# Patient Record
Sex: Female | Born: 1970 | Race: Black or African American | Hispanic: No | Marital: Married | Smoking: Never smoker
Health system: Southern US, Community
[De-identification: ages and names within clinical notes are randomized; demographics above are authoritative.]

## PROBLEM LIST (undated history)

## (undated) DIAGNOSIS — E785 Hyperlipidemia, unspecified: Secondary | ICD-10-CM

## (undated) DIAGNOSIS — E119 Type 2 diabetes mellitus without complications: Secondary | ICD-10-CM

## (undated) DIAGNOSIS — D649 Anemia, unspecified: Secondary | ICD-10-CM

## (undated) DIAGNOSIS — I10 Essential (primary) hypertension: Secondary | ICD-10-CM

## (undated) DIAGNOSIS — D219 Benign neoplasm of connective and other soft tissue, unspecified: Secondary | ICD-10-CM

## (undated) DIAGNOSIS — N200 Calculus of kidney: Secondary | ICD-10-CM

## (undated) HISTORY — DX: Type 2 diabetes mellitus without complications: E11.9

## (undated) HISTORY — PX: LITHOTRIPSY: SUR834

## (undated) HISTORY — DX: Hyperlipidemia, unspecified: E78.5

---

## 1998-12-11 ENCOUNTER — Other Ambulatory Visit: Admission: RE | Admit: 1998-12-11 | Discharge: 1998-12-11 | Payer: Self-pay | Admitting: Obstetrics and Gynecology

## 2000-02-14 ENCOUNTER — Other Ambulatory Visit: Admission: RE | Admit: 2000-02-14 | Discharge: 2000-02-14 | Payer: Self-pay | Admitting: Obstetrics and Gynecology

## 2000-09-17 ENCOUNTER — Encounter: Payer: Self-pay | Admitting: Gynecology

## 2000-09-17 ENCOUNTER — Encounter: Admission: RE | Admit: 2000-09-17 | Discharge: 2000-09-17 | Payer: Self-pay | Admitting: Gynecology

## 2001-01-09 ENCOUNTER — Inpatient Hospital Stay (HOSPITAL_COMMUNITY): Admission: AD | Admit: 2001-01-09 | Discharge: 2001-01-09 | Payer: Self-pay | Admitting: Gynecology

## 2001-03-30 ENCOUNTER — Encounter: Admission: RE | Admit: 2001-03-30 | Discharge: 2001-06-28 | Payer: Self-pay | Admitting: Gynecology

## 2001-06-01 ENCOUNTER — Inpatient Hospital Stay (HOSPITAL_COMMUNITY): Admission: AD | Admit: 2001-06-01 | Discharge: 2001-06-03 | Payer: Self-pay | Admitting: Gynecology

## 2003-02-17 ENCOUNTER — Other Ambulatory Visit: Admission: RE | Admit: 2003-02-17 | Discharge: 2003-02-17 | Payer: Self-pay | Admitting: Gynecology

## 2003-02-24 ENCOUNTER — Encounter: Admission: RE | Admit: 2003-02-24 | Discharge: 2003-02-24 | Payer: Self-pay | Admitting: *Deleted

## 2003-03-06 ENCOUNTER — Encounter: Payer: Self-pay | Admitting: Urology

## 2003-03-06 ENCOUNTER — Ambulatory Visit (HOSPITAL_BASED_OUTPATIENT_CLINIC_OR_DEPARTMENT_OTHER): Admission: RE | Admit: 2003-03-06 | Discharge: 2003-03-06 | Payer: Self-pay | Admitting: Urology

## 2003-11-22 ENCOUNTER — Encounter: Admission: RE | Admit: 2003-11-22 | Discharge: 2003-11-22 | Payer: Self-pay | Admitting: Gynecology

## 2003-12-26 ENCOUNTER — Inpatient Hospital Stay (HOSPITAL_COMMUNITY): Admission: AD | Admit: 2003-12-26 | Discharge: 2003-12-28 | Payer: Self-pay | Admitting: Gynecology

## 2003-12-26 ENCOUNTER — Encounter (INDEPENDENT_AMBULATORY_CARE_PROVIDER_SITE_OTHER): Payer: Self-pay | Admitting: *Deleted

## 2004-02-06 ENCOUNTER — Other Ambulatory Visit: Admission: RE | Admit: 2004-02-06 | Discharge: 2004-02-06 | Payer: Self-pay | Admitting: Gynecology

## 2005-04-04 ENCOUNTER — Other Ambulatory Visit: Admission: RE | Admit: 2005-04-04 | Discharge: 2005-04-04 | Payer: Self-pay | Admitting: Gynecology

## 2006-05-12 ENCOUNTER — Other Ambulatory Visit: Admission: RE | Admit: 2006-05-12 | Discharge: 2006-05-12 | Payer: Self-pay | Admitting: Gynecology

## 2006-05-30 ENCOUNTER — Emergency Department (HOSPITAL_COMMUNITY): Admission: EM | Admit: 2006-05-30 | Discharge: 2006-05-30 | Payer: Self-pay | Admitting: Emergency Medicine

## 2009-09-04 ENCOUNTER — Emergency Department (HOSPITAL_COMMUNITY): Admission: EM | Admit: 2009-09-04 | Discharge: 2009-09-04 | Payer: Self-pay | Admitting: Emergency Medicine

## 2010-09-15 ENCOUNTER — Emergency Department (HOSPITAL_COMMUNITY)
Admission: EM | Admit: 2010-09-15 | Discharge: 2010-09-15 | Payer: Self-pay | Source: Home / Self Care | Admitting: Emergency Medicine

## 2010-11-25 LAB — URINALYSIS, ROUTINE W REFLEX MICROSCOPIC
Bilirubin Urine: NEGATIVE
Glucose, UA: NEGATIVE mg/dL
Ketones, ur: NEGATIVE mg/dL
Leukocytes, UA: NEGATIVE
Nitrite: NEGATIVE
Protein, ur: NEGATIVE mg/dL
Specific Gravity, Urine: 1.012 (ref 1.005–1.030)
Urobilinogen, UA: 0.2 mg/dL (ref 0.0–1.0)
pH: 5.5 (ref 5.0–8.0)

## 2010-11-25 LAB — URINE MICROSCOPIC-ADD ON

## 2010-12-16 LAB — POCT CARDIAC MARKERS
CKMB, poc: <1 ng/mL — ABNORMAL LOW (ref 1.0–8.0)
CKMB, poc: <1 ng/mL — ABNORMAL LOW (ref 1.0–8.0)
Myoglobin, poc: 80.1 ng/mL (ref 12–200)
Myoglobin, poc: 80.4 ng/mL (ref 12–200)
Troponin i, poc: 0.05 ng/mL (ref 0.00–0.09)
Troponin i, poc: <0.05 ng/mL (ref 0.00–0.09)

## 2010-12-16 LAB — BASIC METABOLIC PANEL
BUN: 8 mg/dL (ref 6–23)
CO2: 25 mEq/L (ref 19–32)
Calcium: 8.6 mg/dL (ref 8.4–10.5)
Chloride: 104 mEq/L (ref 96–112)
Creatinine, Ser: 0.75 mg/dL (ref 0.4–1.2)
GFR calc Af Amer: >60 mL/min (ref >60)
GFR calc non Af Amer: >60 mL/min (ref >60)
Glucose, Bld: 111 mg/dL — ABNORMAL HIGH (ref 70–99)
Potassium: 3.7 mEq/L (ref 3.5–5.1)
Sodium: 136 mEq/L (ref 135–145)

## 2011-01-31 NOTE — Discharge Summary (Signed)
[facility]  Patient:    Eileen Larsen, Eileen Larsen Visit Number: [id number] MRN: [id number]          Service Type: OBS Location: 910A 9110 01 Attending Physician:  Douglass Rivers Dictated by:   Antony Contras, [facility] Admit Date:  06/01/2001 Discharge Date: 06/03/2001                             Discharge Summary  DISCHARGE DIAGNOSES:          1. Intrauterine pregnancy at term.                               2. Spontaneous onset of labor.   PROCEDURES:                   Normal spontaneous vaginal delivery of viable infant over intact perineum with repair of first degree laceration.  HISTORY OF PRESENT ILLNESS:   The patient is a 40 year old gravida 3, para 0-0-2-0 with an LMP 09/01/00, [facility] 06/08/01.  Prenatal course was complicated by gestational diabetes which was diet controlled.  LABORATORY DATA:              Blood type A positive, antibody screen negative. RPR, HBsAG, HIV nonreactive.  Rubella immune.  MSAFP normal.  _______ was negative.  GBS negative.  HOSPITAL COURSE/TREATMENT:    The patient was admitted June 01, 2001 with spontaneous onset of labor at 39 weeks.  Cervix was 3 cm, 100% and -1 station. Artificial rupture of membrane reveals clear fluid.  The patient progressed to complete dilatation delivered an Apgar 9/9 female infant weighing 7 pounds 6 ounces over intact perineum.  First degree laceration was repaired.  POSTPARTUM COURSE:            The patient remained afebrile, had no difficulty voiding and was able to be discharged on her second postpartum day in satisfactory condition.  CBC: Hematocrit 30.2, hemoglobin 10.2, WBC 14.2, platelets 215.  DISPOSITION:                  Follow up in six weeks.  Continue prenatal vitamins with iron, Motrin/Tylox for pain. Dictated by:   Antony Contras, [facility] Attending Physician:  Douglass Rivers DD:  07/02/01 TD:  07/05/01 Job: [id number]

## 2011-01-31 NOTE — Discharge Summary (Signed)
NAME:  Eileen Larsen, Eileen Larsen                        ACCOUNT NO.:  [id number]   MEDICAL RECORD NO.:  [id number]                   PATIENT TYPE:  INP   LOCATION:  9128                                 FACILITY:  [facility]   PHYSICIAN:  Timothy P. Fontaine, M.D.           DATE OF BIRTH:  May 31, 1971   DATE OF ADMISSION:  12/26/2003  DATE OF DISCHARGE:  12/28/2003                                 DISCHARGE SUMMARY   DISCHARGE DIAGNOSES:  1. Pregnancy at term, delivered.  2. History of gestational diabetes.   PROCEDURE:  Spontaneous vaginal delivery of normal female infant on December 26, 2003.   HOSPITAL COURSE:  A 40 year old G67 P1 female at term admitted with regular  contractions at 3 cm dilatation.  The patient progressed without difficulty  and underwent a spontaneous vaginal delivery producing a normal female infant,  Apgars 9 and 9 at 21.  The patient had a second small midline laceration  which was repaired without difficulty.  Her postpartum course was  uncomplicated and she was discharged on postpartum day #2 with a hemoglobin  of 10.3.  Her blood type is A positive and her rubella titer was positive.  The patient was given precautions, instructions, and follow-up information,  will be seen in the office in 4-6 weeks having been instructed to check her  blood sugars prior to her postpartum checkup.  She was given a prescription  for Percocet #20 one to two p.o. q.4-6h. p.r.n. pain.                                               Timothy P. Audie Box, M.D.    TPF/MEDQ  D:  01/09/2004  T:  01/09/2004  Job:  [id number]

## 2014-01-07 ENCOUNTER — Telehealth (HOSPITAL_BASED_OUTPATIENT_CLINIC_OR_DEPARTMENT_OTHER): Payer: Self-pay | Admitting: *Deleted

## 2014-01-07 ENCOUNTER — Encounter (HOSPITAL_BASED_OUTPATIENT_CLINIC_OR_DEPARTMENT_OTHER): Payer: Self-pay | Admitting: Emergency Medicine

## 2014-01-07 ENCOUNTER — Emergency Department (HOSPITAL_BASED_OUTPATIENT_CLINIC_OR_DEPARTMENT_OTHER)
Admission: EM | Admit: 2014-01-07 | Discharge: 2014-01-07 | Disposition: A | Discharge status: Home / Self Care | Payer: Managed Care, Other (non HMO) | Attending: Emergency Medicine | Admitting: Emergency Medicine

## 2014-01-07 DIAGNOSIS — Z79899 Other long term (current) drug therapy: Secondary | ICD-10-CM | POA: Insufficient documentation

## 2014-01-07 DIAGNOSIS — I10 Essential (primary) hypertension: Secondary | ICD-10-CM | POA: Insufficient documentation

## 2014-01-07 DIAGNOSIS — Z87442 Personal history of urinary calculi: Secondary | ICD-10-CM | POA: Insufficient documentation

## 2014-01-07 DIAGNOSIS — B0052 Herpesviral keratitis: Secondary | ICD-10-CM | POA: Insufficient documentation

## 2014-01-07 DIAGNOSIS — Z792 Long term (current) use of antibiotics: Secondary | ICD-10-CM | POA: Insufficient documentation

## 2014-01-07 DIAGNOSIS — R51 Headache: Secondary | ICD-10-CM | POA: Insufficient documentation

## 2014-01-07 HISTORY — DX: Essential (primary) hypertension: I10

## 2014-01-07 HISTORY — DX: Calculus of kidney: N20.0

## 2014-01-07 MED ORDER — TRIFLURIDINE 1 % OP SOLN: 1.0000 [drp] | OPHTHALMIC | Status: DC

## 2014-01-07 MED ORDER — TETRACAINE HCL 0.5 % OP SOLN
2.0000 [drp] | Freq: Once | OPHTHALMIC | Status: AC
  Administered 2014-01-07: 2 [drp] via OPHTHALMIC
  Filled 2014-01-07: qty 2

## 2014-01-07 MED ORDER — FLUORESCEIN SODIUM 1 MG OP STRP
1.0000 | ORAL_STRIP | Freq: Once | OPHTHALMIC | Status: AC
  Administered 2014-01-07: 1 via OPHTHALMIC
  Filled 2014-01-07: qty 1

## 2014-01-07 MED ORDER — TRIFLURIDINE 1 % OP SOLN
1.0000 [drp] | OPHTHALMIC | Status: DC
  Filled 2014-01-07: qty 7.5

## 2014-01-07 NOTE — ED Notes (Signed)
Dr. Tamera Punt is aware that we do not have Viroptic on our formulary here at [facility].

## 2014-01-07 NOTE — ED Notes (Signed)
The woods lamp and slit lamp are the bedside set up and ready for the doctor to use.

## 2014-01-07 NOTE — ED Provider Notes (Signed)
CSN: [id number]     Arrival date & time 01/07/14  1639 History  This chart was scribed for Malvin Johns, MD by Marcha Dutton, ED Scribe. This patient was seen in room MH06/MH06 and the patient's care was started at 4:59 PM.    Chief Complaint  Patient presents with  . Eye Problem     The history is provided by the patient. No language interpreter was used.   HPI Comments: Eileen Larsen is a 43 y.o. female who presents to the Emergency Department complaining of mild right eye pain that began 3 weeks ago. She states it feels as though something is stuck in her eye. Pt reports difficulty keeping rt eye open, blurred vision for 5 days, photophobia, eye discharge, and a headache. She was seen at an urgent care the first week in April while on vacation for 3 or so raised pus like blisters around her lower right eye lid that were neither itchy nor painful that lasted for 5 days. She reports she was given prednisone, which did clear up her bumps, but states she still felt like something was in her eye. She reports being seen again by her PCP 4/17 and received Vigamox eye drops which she has been using for a week. She states the feeling has not improved, that she's struggling to keep her eyes open, and her blurred vision is worsening.    Past Medical History  Diagnosis Date  . Hypertension   . Kidney stone    Past Surgical History  Procedure Laterality Date  . Lithotripsy     No family history on file. History  Substance Use Topics  . Smoking status: Never Smoker   . Smokeless tobacco: Never Used  . Alcohol Use: Yes     Comment: rare   OB History   Grav Para Term Preterm Abortions TAB SAB Ect Mult Living                 Review of Systems  Constitutional: Negative for fever, chills, diaphoresis and fatigue.  HENT: Negative for congestion, rhinorrhea and sneezing.   Eyes: Positive for photophobia, pain, discharge and visual disturbance.  Respiratory: Negative for cough, chest  tightness and shortness of breath.   Cardiovascular: Negative for chest pain and leg swelling.  Gastrointestinal: Negative for nausea, vomiting, abdominal pain, diarrhea and blood in stool.  Genitourinary: Negative for frequency, hematuria, flank pain and difficulty urinating.  Musculoskeletal: Negative for arthralgias and back pain.  Skin: Negative for rash.  Neurological: Positive for headaches. Negative for dizziness, speech difficulty, weakness and numbness.      Allergies  Review of patient's allergies indicates no known allergies.  Home Medications   Prior to Admission medications   Medication Sig Start Date End Date Taking? Authorizing Provider  amLODipine (NORVASC) 5 MG tablet Take 5 mg by mouth daily.   Yes Historical Provider, MD  folic acid (FOLVITE) 1 MG tablet Take 1 mg by mouth daily.   Yes Historical Provider, MD  hydrochlorothiazide (HYDRODIURIL) 25 MG tablet Take 25 mg by mouth daily.   Yes Historical Provider, MD  moxifloxacin (VIGAMOX) 0.5 % ophthalmic solution Place 1 drop into the right eye 3 (three) times daily.   Yes Historical Provider, MD   Triage Vitals: BP 143/93  Temp(Src) 98.7 F (37.1 C) (Oral)  Resp 20  Ht 5' 6.5" (1.689 m)  Wt 190 lb (86.183 kg)  BMI 30.21 kg/m2  SpO2 99%  LMP 12/21/2013  Physical Exam  Nursing note  and vitals reviewed. Constitutional: She is oriented to person, place, and time. She appears well-developed and well-nourished.  HENT:  Head: Normocephalic and atraumatic.  Eyes: Pupils are equal, round, and reactive to light. Right eye exhibits discharge (mild watery).  Erythema of the right conjunctiva with mild watery discharge, no rashes, no redness around the eye, pupillary reflex normal, no foreign bodies noted Slit lamp exam reveals +dendrite.  Pressure 21 right eye with tonopen.  Neck: Normal range of motion. Neck supple.  Cardiovascular: Normal rate, regular rhythm and normal heart sounds.   Pulmonary/Chest: Effort normal  and breath sounds normal. No respiratory distress. She has no wheezes. She has no rales. She exhibits no tenderness.  Abdominal: Soft. Bowel sounds are normal. There is no tenderness. There is no rebound and no guarding.  Musculoskeletal: Normal range of motion. She exhibits no edema.  Lymphadenopathy:    She has no cervical adenopathy.  Neurological: She is alert and oriented to person, place, and time.  Skin: Skin is warm and dry. No rash noted.  Psychiatric: She has a normal mood and affect.    ED Course  Procedures (including critical care time)  DIAGNOSTIC STUDIES: Oxygen Saturation is 99% on RA, normal by my interpretation.    COORDINATION OF CARE: 5:09 PM- Pt advised of plan for treatment and pt agrees.  Labs Review Labs Reviewed - No data to display  Imaging Review No results found.   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  Spoke with Dr. Posey Pronto, on call for optho who will come her to see the patient.  17:24: Dr Posey Pronto has seen, recommends viroptic drops, will see her again on Monday  I personally performed the services described in this documentation, which was scribed in my presence.  The recorded information has been reviewed and considered.    Malvin Johns, MD 01/07/14 [phone]

## 2014-01-07 NOTE — Telephone Encounter (Signed)
Pt calling to see what pharmacy they called her Rx into.  Pt told it was printed out with discharge papers.  Pt found it.  Unable to tell pt which pharmacy had the prescription in stock.

## 2014-01-07 NOTE — ED Notes (Signed)
Reports right problem x 3 weeks- seen at urgent care while at beach first week of April and was given prednisone- states feels like something is in eye- saw PCP on april 17 and was given eye drops- states sx not improving

## 2014-01-07 NOTE — Consult Note (Signed)
Eileen Larsen                                                                               01/07/2014                                               Pediatric Ophthalmology Consultation                                         Consult requested by: Dr. Malvin Johns  Reason for consultation:  Dendrite OD  HPI: Pt is a 43yo female who reports bumps on her right lower lid three weeks ago during a trip to the beach. She was placed on prednisone and the bumps resolved. She saw her PCP on 4/17 for continued irritation and received Vigamox TID, which she's been using for the last week. She currently complains of FBS OD with some blurring of vision.  Pertinent Medical History:   Active Ambulatory Problems    Diagnosis Date Noted  . No Active Ambulatory Problems   Resolved Ambulatory Problems    Diagnosis Date Noted  . No Resolved Ambulatory Problems   Past Medical History  Diagnosis Date  . Hypertension   . Kidney stone     Pertinent Ophthalmic History: as above  Current Eye Medications: Vigamox TID OD x1wk  Systemic medications on admission:   (Not in a hospital admission)     ROS: as per HPI  Visual Fields: FTC OU      Pupils:  Equal, brisk, no APD  Near acuity:   [place]    OD   CSM  J1      [place]       OS   CSM  J1+  TA:       OD: 17 mmhg    OS: 17 mmhg  by Tonopen  Dilation:  both eyes        Medication used  [  ] NS 2.5% [  ]Tropicamide  [ x ] Cyclogyl [  ] Cyclomydril  External:   OD:  Normal      OS:  Normal     Anterior segment exam:  By slit lamp  Conjunctiva:  OD:  Trace injection   OS:  Quiet    Cornea:    OD: Fluorescein uptake: dendrite in star pattern, ~1.89mm round, offset inferonasal from pupil   OS: Clear, no fluorescein stain     Anterior Chamber:   OD:  Deep/quiet     OS:  Deep/quiet    Iris:    OD:  Normal      OS:  Normal     Lens:    OD:  Clear        OS:  Clear         Optic disc:  OD:  Flat, sharp, pink, healthy     OS:  Flat, sharp,  pink, healthy     Central retina--examined with indirect ophthalmoscope:  OD:  Macula and vessels normal; media clear     OS:  Macula and vessels normal; media clear     Impression:   HSV keratitis  Recommendations/Plan: 1. Viroptic q1.5hrs (9x/day) 2. Discontinue Vigamox eye drops 3. Followup at Dr. Serita Grit office on Monday (4/27). Patient should call the office at [phone] on Monday morning at 8am for an appointment.   I've discussed these findings with the nurse and/or physician. Please contact our office with any questions or concerns at [phone]. Thank you for calling us to care for this nice woman.  Lovie Chol

## 2014-01-07 NOTE — Discharge Instructions (Signed)
Herpes Keratitis °The term "keratitis" refers to an inflammation of the cornea (clear covering at the front of the eye). When you look at the color of a person's eye, you are looking through the clear cornea to the colored iris, which is inside the eye. The cornea is an extremely sensitive tissue. This is why you immediately blink when something touches your eye, or even if you think something is going to touch your eye.  °One of the most common forms of keratitis is produced by the herpes virus. There are different types of herpes infections. Herpes Simplex Virus 1 (HSV-1) usually causes cold sores and eye infections. HSV-2 is usually, but not always, the cause of sexually transmitted herpes infections. There is another type of herpes virus called Herpes Zoster, which is the cause of a painful rash known as "shingles." °When shingles affects the face, there may be an associated herpes infection or inflammation in the eye on the same side as the rash. This makes it very important to have your eye checked, but this is typically not a herpes keratitis infection.  °Most people, at one time or another, have had some form of herpes virus in their system. Stress, fatigue, sunlight, surgery, illness, the use of certain drugs like steroids, and certain foods are all factors that may make the herpes virus flare up again, causing a herpes-related illness. °One of the places that the virus can cause inflammation is on the surface of the cornea. For this reason, if you have an active herpes infection, such as a cold sore, it is very important to avoid contact to your eyes with your fingers, since the virus may spread. °SYMPTOMS  °Herpes keratitis almost always occurs in only one eye at a time. Herpes keratitis causes: °· Pain in the affected eye. °· Extreme light sensitivity. °· Eye is typically red and inflamed. °· Vision may be blurred. °· Eye may tear excessively. °DIAGNOSIS  °Herpes Keratitis has a very specific pattern of  inflammation on the cornea. It is so typical that an eye specialist can tell almost immediately what is wrong, by simply looking at the eye with a special microscope and with a small amount of special green dye placed in the eye. Herpes keratitis forms a branch-like pattern of inflammation known as a "dendrite." For this reason, it is also called "dendritic keratitis." °RELATED COMPLICATIONS °Without treatment, herpes keratitis can cause scarring of the cornea and loss of vision. It can come back even after it has been successfully treated. This usually happens during a generalized illness, when all herpes viruses are prone to flare up. The inflammation can also spread to the inside of the eye, causing scarring. The side effects of such scarring can result in complications and such conditions as glaucoma and cataracts. °It is very important to know exactly what type of keratitis you have, and to know the cause of any red eye. This is because certain medicines, which are commonly used as eye drops, can make a herpes keratitis infection much worse very fast. Steroid drops (prednisone) for instance, can rapidly make a herpes infection worse, if used at the wrong time, while the virus is still active. °TREATMENT  °Medicines are available for the treatment of herpes keratitis. The medicines used will depend on how much of the eye is involved. It may also depend on whether there is live virus still in the eye.  °Your caregiver may want you to have a complete physical examination, to be sure that nothing   else is going on in your body that allowed the herpes virus to flare up. Even after successful treatment, one third of people with herpes virus will have a recurrence.  HOME CARE INSTRUCTIONS   Take all medicines as instructed. Take pain medicines only as prescribed by your caregiver. Do not self medicate with eye drops, unless instructed to do so.  Keep all appointments as instructed. Remember, this illness is a  leading cause of blindness. MAKE SURE YOU:   Understand these instructions.  Will watch your condition.  Will get help right away if you are not doing well or get worse. Document Released: 05/27/2001 Document Revised: 11/24/2011 Document Reviewed: 07/23/2009 [facility] Patient Information 2014 [place], [place].

## 2014-04-28 ENCOUNTER — Other Ambulatory Visit: Payer: Self-pay

## 2014-04-28 DIAGNOSIS — Z1231 Encounter for screening mammogram for malignant neoplasm of breast: Secondary | ICD-10-CM

## 2014-05-05 ENCOUNTER — Ambulatory Visit
Admission: RE | Admit: 2014-05-05 | Discharge: 2014-05-05 | Disposition: A | Discharge status: Home / Self Care | Payer: Managed Care, Other (non HMO) | Source: Ambulatory Visit

## 2014-05-05 DIAGNOSIS — Z1231 Encounter for screening mammogram for malignant neoplasm of breast: Secondary | ICD-10-CM

## 2014-09-23 ENCOUNTER — Encounter (HOSPITAL_COMMUNITY): Payer: Self-pay | Admitting: Emergency Medicine

## 2014-09-23 ENCOUNTER — Emergency Department (HOSPITAL_COMMUNITY)
Admission: EM | Admit: 2014-09-23 | Discharge: 2014-09-23 | Disposition: A | Discharge status: Home / Self Care | Payer: Managed Care, Other (non HMO) | Attending: Emergency Medicine | Admitting: Emergency Medicine

## 2014-09-23 DIAGNOSIS — Y998 Other external cause status: Secondary | ICD-10-CM | POA: Insufficient documentation

## 2014-09-23 DIAGNOSIS — M542 Cervicalgia: Secondary | ICD-10-CM

## 2014-09-23 DIAGNOSIS — Z87442 Personal history of urinary calculi: Secondary | ICD-10-CM | POA: Diagnosis not present

## 2014-09-23 DIAGNOSIS — I10 Essential (primary) hypertension: Secondary | ICD-10-CM | POA: Insufficient documentation

## 2014-09-23 DIAGNOSIS — Y92410 Unspecified street and highway as the place of occurrence of the external cause: Secondary | ICD-10-CM | POA: Insufficient documentation

## 2014-09-23 DIAGNOSIS — M79602 Pain in left arm: Secondary | ICD-10-CM

## 2014-09-23 DIAGNOSIS — Z792 Long term (current) use of antibiotics: Secondary | ICD-10-CM | POA: Insufficient documentation

## 2014-09-23 DIAGNOSIS — S60512A Abrasion of left hand, initial encounter: Secondary | ICD-10-CM | POA: Diagnosis not present

## 2014-09-23 DIAGNOSIS — Z79899 Other long term (current) drug therapy: Secondary | ICD-10-CM | POA: Diagnosis not present

## 2014-09-23 DIAGNOSIS — S5012XA Contusion of left forearm, initial encounter: Secondary | ICD-10-CM | POA: Diagnosis not present

## 2014-09-23 DIAGNOSIS — Y9389 Activity, other specified: Secondary | ICD-10-CM | POA: Insufficient documentation

## 2014-09-23 DIAGNOSIS — S199XXA Unspecified injury of neck, initial encounter: Secondary | ICD-10-CM | POA: Insufficient documentation

## 2014-09-23 DIAGNOSIS — S4992XA Unspecified injury of left shoulder and upper arm, initial encounter: Secondary | ICD-10-CM | POA: Diagnosis present

## 2014-09-23 MED ORDER — DIAZEPAM 5 MG PO TABS: 5.0000 mg | ORAL_TABLET | Freq: Two times a day (BID) | ORAL | Status: DC

## 2014-09-23 MED ORDER — OXYCODONE-ACETAMINOPHEN 5-325 MG PO TABS
1.0000 | ORAL_TABLET | Freq: Once | ORAL | Status: AC
  Administered 2014-09-23: 1 via ORAL
  Filled 2014-09-23: qty 1

## 2014-09-23 MED ORDER — HYDROCODONE-ACETAMINOPHEN 5-325 MG PO TABS: 1.0000 | ORAL_TABLET | ORAL | Status: DC | PRN

## 2014-09-23 MED ORDER — IBUPROFEN 600 MG PO TABS: 600.0000 mg | ORAL_TABLET | Freq: Four times a day (QID) | ORAL | Status: DC | PRN
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
## 2015-05-24 ENCOUNTER — Ambulatory Visit: Payer: Managed Care, Other (non HMO)

## 2015-05-31 ENCOUNTER — Ambulatory Visit: Payer: Managed Care, Other (non HMO)

## 2015-06-07 ENCOUNTER — Ambulatory Visit: Payer: Managed Care, Other (non HMO)

## 2015-06-26 ENCOUNTER — Encounter: Payer: Managed Care, Other (non HMO) | Attending: Family Medicine

## 2015-06-26 VITALS — Ht 66.5 in | Wt 191.0 lb

## 2015-06-26 DIAGNOSIS — E119 Type 2 diabetes mellitus without complications: Secondary | ICD-10-CM | POA: Diagnosis not present

## 2015-06-26 DIAGNOSIS — Z713 Dietary counseling and surveillance: Secondary | ICD-10-CM | POA: Insufficient documentation

## 2015-06-29 NOTE — Progress Notes (Signed)
Patient was seen on 06/26/15 for the first of a series of three diabetes self-management courses at [facility].  Patient Education Plan per assessed needs and concerns is to attend four course education program for Diabetes Self Management Education.  The following learning objectives were met by the patient during this class:  Describe diabetes  State some common risk factors for diabetes  Defines the role of glucose and insulin  Identifies type of diabetes and pathophysiology  Describe the relationship between diabetes and cardiovascular risk  State the members of the Healthcare Team  States the rationale for glucose monitoring  State when to test glucose  State their individual Target Range  State the importance of logging glucose readings  Describe how to interpret glucose readings  Identifies A1C target  Explain the correlation between A1c and eAG values  State symptoms and treatment of high blood glucose  State symptoms and treatment of low blood glucose  Explain proper technique for glucose testing  Identifies proper sharps disposal  Handouts given during class include:  Living Well with Diabetes book  Carb Counting and Meal Planning book  Meal Plan Card  Carbohydrate guide  Meal planning worksheet  Low Sodium Flavoring Tips  The diabetes portion plate  K9Z to eAG Conversion Chart  Diabetes Medications  Diabetes Recommended Care Schedule  Support Group  Diabetes Success Plan  Core Class Satisfaction Survey  Follow-Up Plan:  Attend core 2

## 2015-07-03 DIAGNOSIS — E119 Type 2 diabetes mellitus without complications: Secondary | ICD-10-CM

## 2015-07-04 NOTE — Progress Notes (Signed)

## 2015-07-10 DIAGNOSIS — E119 Type 2 diabetes mellitus without complications: Secondary | ICD-10-CM

## 2015-07-12 NOTE — Progress Notes (Signed)
Patient was seen on 07/10/15 for the third of a series of three diabetes self-management courses at the [facility].   Catalina Gravel the amount of activity recommended for healthy living . Describe activities suitable for individual needs . Identify ways to regularly incorporate activity into daily life . Identify barriers to activity and ways to over come these barriers  Identify diabetes medications being personally used and their primary action for lowering glucose and possible side effects . Describe role of stress on blood glucose and develop strategies to address psychosocial issues . Identify diabetes complications and ways to prevent them  Explain how to manage diabetes during illness . Evaluate success in meeting personal goal . Establish 2-3 goals that they will plan to diligently work on until they return for the  24-month follow-up visit  Goals:   I will count my carb choices at most meals and snacks  I will test my glucose at least 1 times a day, 4 days a week  Your patient has identified these potential barriers to change:  Motivation  Your patient has identified their diabetes self-care support plan as  Family Support Plan:  Attend Optional Core 4 in 4 months

## 2015-12-05 ENCOUNTER — Ambulatory Visit: Payer: Managed Care, Other (non HMO)

## 2015-12-17 DIAGNOSIS — E119 Type 2 diabetes mellitus without complications: Secondary | ICD-10-CM | POA: Diagnosis not present

## 2015-12-17 DIAGNOSIS — E785 Hyperlipidemia, unspecified: Secondary | ICD-10-CM | POA: Diagnosis not present

## 2015-12-17 DIAGNOSIS — Z7984 Long term (current) use of oral hypoglycemic drugs: Secondary | ICD-10-CM | POA: Diagnosis not present

## 2015-12-27 DIAGNOSIS — H2513 Age-related nuclear cataract, bilateral: Secondary | ICD-10-CM | POA: Diagnosis not present

## 2015-12-27 DIAGNOSIS — E119 Type 2 diabetes mellitus without complications: Secondary | ICD-10-CM | POA: Diagnosis not present

## 2016-01-21 DIAGNOSIS — Z01419 Encounter for gynecological examination (general) (routine) without abnormal findings: Secondary | ICD-10-CM | POA: Diagnosis not present

## 2016-01-21 DIAGNOSIS — D252 Subserosal leiomyoma of uterus: Secondary | ICD-10-CM | POA: Diagnosis not present

## 2016-01-21 DIAGNOSIS — Z1151 Encounter for screening for human papillomavirus (HPV): Secondary | ICD-10-CM | POA: Diagnosis not present

## 2016-01-21 DIAGNOSIS — Z1231 Encounter for screening mammogram for malignant neoplasm of breast: Secondary | ICD-10-CM | POA: Diagnosis not present

## 2016-01-21 DIAGNOSIS — Z6830 Body mass index (BMI) 30.0-30.9, adult: Secondary | ICD-10-CM | POA: Diagnosis not present

## 2016-01-21 DIAGNOSIS — N870 Mild cervical dysplasia: Secondary | ICD-10-CM | POA: Diagnosis not present

## 2016-05-13 DIAGNOSIS — Z0000 Encounter for general adult medical examination without abnormal findings: Secondary | ICD-10-CM | POA: Diagnosis not present

## 2016-05-13 DIAGNOSIS — E119 Type 2 diabetes mellitus without complications: Secondary | ICD-10-CM | POA: Diagnosis not present

## 2016-05-13 DIAGNOSIS — E785 Hyperlipidemia, unspecified: Secondary | ICD-10-CM | POA: Diagnosis not present

## 2016-05-13 DIAGNOSIS — Z7984 Long term (current) use of oral hypoglycemic drugs: Secondary | ICD-10-CM | POA: Diagnosis not present

## 2016-05-13 DIAGNOSIS — Z23 Encounter for immunization: Secondary | ICD-10-CM | POA: Diagnosis not present

## 2016-05-13 DIAGNOSIS — I10 Essential (primary) hypertension: Secondary | ICD-10-CM | POA: Diagnosis not present

## 2016-09-03 DIAGNOSIS — E119 Type 2 diabetes mellitus without complications: Secondary | ICD-10-CM | POA: Diagnosis not present

## 2016-09-03 DIAGNOSIS — Z7984 Long term (current) use of oral hypoglycemic drugs: Secondary | ICD-10-CM | POA: Diagnosis not present

## 2016-11-13 DIAGNOSIS — I10 Essential (primary) hypertension: Secondary | ICD-10-CM | POA: Diagnosis not present

## 2016-11-13 DIAGNOSIS — E785 Hyperlipidemia, unspecified: Secondary | ICD-10-CM | POA: Diagnosis not present

## 2016-11-13 DIAGNOSIS — E119 Type 2 diabetes mellitus without complications: Secondary | ICD-10-CM | POA: Diagnosis not present

## 2016-12-02 DIAGNOSIS — E119 Type 2 diabetes mellitus without complications: Secondary | ICD-10-CM | POA: Diagnosis not present

## 2016-12-02 DIAGNOSIS — Z7984 Long term (current) use of oral hypoglycemic drugs: Secondary | ICD-10-CM | POA: Diagnosis not present

## 2016-12-02 DIAGNOSIS — E785 Hyperlipidemia, unspecified: Secondary | ICD-10-CM | POA: Diagnosis not present

## 2016-12-29 DIAGNOSIS — R52 Pain, unspecified: Secondary | ICD-10-CM | POA: Diagnosis not present

## 2016-12-29 DIAGNOSIS — J101 Influenza due to other identified influenza virus with other respiratory manifestations: Secondary | ICD-10-CM | POA: Diagnosis not present

## 2017-01-14 DIAGNOSIS — H35033 Hypertensive retinopathy, bilateral: Secondary | ICD-10-CM | POA: Diagnosis not present

## 2017-01-14 DIAGNOSIS — E119 Type 2 diabetes mellitus without complications: Secondary | ICD-10-CM | POA: Diagnosis not present

## 2017-01-14 DIAGNOSIS — H2513 Age-related nuclear cataract, bilateral: Secondary | ICD-10-CM | POA: Diagnosis not present

## 2017-02-13 DIAGNOSIS — E119 Type 2 diabetes mellitus without complications: Secondary | ICD-10-CM | POA: Diagnosis not present

## 2017-02-13 DIAGNOSIS — H35033 Hypertensive retinopathy, bilateral: Secondary | ICD-10-CM | POA: Diagnosis not present

## 2017-02-13 DIAGNOSIS — H2513 Age-related nuclear cataract, bilateral: Secondary | ICD-10-CM | POA: Diagnosis not present

## 2017-02-13 DIAGNOSIS — B0239 Other herpes zoster eye disease: Secondary | ICD-10-CM | POA: Diagnosis not present

## 2017-02-13 DIAGNOSIS — H5710 Ocular pain, unspecified eye: Secondary | ICD-10-CM | POA: Diagnosis not present

## 2017-02-20 DIAGNOSIS — B0239 Other herpes zoster eye disease: Secondary | ICD-10-CM | POA: Diagnosis not present

## 2017-03-20 DIAGNOSIS — Z01419 Encounter for gynecological examination (general) (routine) without abnormal findings: Secondary | ICD-10-CM | POA: Diagnosis not present

## 2017-03-20 DIAGNOSIS — Z1231 Encounter for screening mammogram for malignant neoplasm of breast: Secondary | ICD-10-CM | POA: Diagnosis not present

## 2017-03-20 DIAGNOSIS — Z6831 Body mass index (BMI) 31.0-31.9, adult: Secondary | ICD-10-CM | POA: Diagnosis not present

## 2017-03-20 DIAGNOSIS — Z1151 Encounter for screening for human papillomavirus (HPV): Secondary | ICD-10-CM | POA: Diagnosis not present

## 2017-03-31 DIAGNOSIS — N920 Excessive and frequent menstruation with regular cycle: Secondary | ICD-10-CM | POA: Diagnosis not present

## 2017-05-04 ENCOUNTER — Other Ambulatory Visit: Payer: Self-pay | Admitting: Obstetrics and Gynecology

## 2017-05-04 DIAGNOSIS — D250 Submucous leiomyoma of uterus: Secondary | ICD-10-CM

## 2017-05-06 ENCOUNTER — Other Ambulatory Visit: Payer: Self-pay | Admitting: Obstetrics and Gynecology

## 2017-05-06 ENCOUNTER — Ambulatory Visit
Admission: RE | Admit: 2017-05-06 | Discharge: 2017-05-06 | Disposition: A | Discharge status: Home / Self Care | Payer: BLUE CROSS/BLUE SHIELD | Source: Ambulatory Visit | Attending: Obstetrics and Gynecology | Admitting: Obstetrics and Gynecology

## 2017-05-06 DIAGNOSIS — D250 Submucous leiomyoma of uterus: Secondary | ICD-10-CM

## 2017-05-06 DIAGNOSIS — D259 Leiomyoma of uterus, unspecified: Secondary | ICD-10-CM | POA: Diagnosis not present

## 2017-05-06 HISTORY — PX: IR RADIOLOGIST EVAL & MGMT: IMG5224

## 2017-05-06 HISTORY — DX: Benign neoplasm of connective and other soft tissue, unspecified: D21.9

## 2017-05-06 NOTE — Consult Note (Signed)
Chief Complaint: Patient was seen in consultation today for  Chief Complaint  Patient presents with  . Advice Only    Consult for [place]     at the request of Cousins,Sheronette  Referring Physician(s): Cousins,Sheronette  History of Present Illness: Eileen Larsen is a 46 y.o. female with known uterine fibroids and menorrhagia. The patient has been complaining of heavy menstrual bleeding with some irregular cycles for the past 6 months. The bleeding usually last 5 days with 3 days of heavy bleeding. At this time, the heavy bleeding is becoming very frustrating for the patient and she would like to seek treatment options. She is not interested in a hysterectomy at this time. Patient is not on oral contraceptives and not planning to get pregnant in the future. Pregnancy history is G4, P2. History of yeast infection. Patient's past medical history is significant for hypertension and diabetes. Patient just returned from a vacation. She became nauseated and passed out on the airplane just yesterday. She says that her blood pressure and glucose were within normal limits after this event. She's been asymptomatic since this one episode. Patient had a negative Pap smear on 03/20/2017 and negative endometrial biopsy on 03/31/2017.  Past Medical History:  Diagnosis Date  . Diabetes mellitus without complication ([place])   . Fibroids   . Hyperlipidemia   . Hypertension   . Kidney stone     Past Surgical History:  Procedure Laterality Date  . LITHOTRIPSY      Allergies: Patient has no known allergies.  Medications: Prior to Admission medications   Medication Sig Start Date End Date Taking? Authorizing Provider  amLODipine (NORVASC) 5 MG tablet Take 5 mg by mouth daily.   Yes [provider]  atorvastatin (LIPITOR) 20 MG tablet Take 20 mg by mouth daily.   Yes [provider]  hydrochlorothiazide (HYDRODIURIL) 25 MG tablet Take 25 mg by mouth daily.   Yes [provider]  ibuprofen (ADVIL,MOTRIN) 600 MG tablet Take 1 tablet (600 mg total) by mouth every 6 (six) hours as needed. 09/23/14  Yes Phelps, Erin O, PA-C  metFORMIN (GLUCOPHAGE) 500 MG tablet Take 1,000 mg by mouth 2 (two) times daily with a meal.    Yes [provider]  diazepam (VALIUM) 5 MG tablet Take 1 tablet (5 mg total) by mouth 2 (two) times daily. Patient not taking: Reported on 05/06/2017 09/23/14   Noe Gens, PA-C  folic acid (FOLVITE) 1 MG tablet Take 1 mg by mouth daily.    [provider]  HYDROcodone-acetaminophen (NORCO/VICODIN) 5-325 MG per tablet Take 1-2 tablets by mouth every 4 (four) hours as needed for moderate pain or severe pain. Patient not taking: Reported on 05/06/2017 09/23/14   Noe Gens, PA-C  moxifloxacin (VIGAMOX) 0.5 % ophthalmic solution Place 1 drop into the right eye 3 (three) times daily.    [provider]  trifluridine (VIROPTIC) 1 % ophthalmic solution Place 1 drop into the right eye every 2 (two) hours. Use drops 9 times per day Patient not taking: Reported on 05/06/2017 01/07/14   Malvin Johns, MD     No family history on file.  Social History   Social History  . Marital status: Single    Spouse name: N/A  . Number of children: N/A  . Years of education: N/A   Social History Main Topics  . Smoking status: Never Smoker  . Smokeless tobacco: Never Used  . Alcohol use Yes  Comment: rare  . Drug use: No  . Sexual activity: Yes    Birth control/ protection: Coitus interruptus   Other Topics Concern  . Not on file   Social History Narrative  . No narrative on file    Review of Systems  Respiratory: Negative.   Cardiovascular: Negative.   Gastrointestinal: Positive for nausea.  Genitourinary: Positive for menstrual problem and vaginal bleeding.  Neurological: Positive for dizziness and syncope.    Vital Signs: BP 136/87   Pulse 82   Temp 98.1 F (36.7 C) (Oral)   Resp 16   Ht 5' 6.5" (1.689 m)    Wt 189 lb (85.7 kg)   LMP 05/03/2017 (Exact Date)   SpO2 100%   BMI 30.05 kg/m   Physical Exam  Constitutional: No distress.  Cardiovascular: Normal rate, regular rhythm, normal heart sounds and intact distal pulses.   Pulmonary/Chest: Effort normal and breath sounds normal. No respiratory distress. She has no wheezes.  Abdominal: Soft. She exhibits no distension. There is no tenderness.  Musculoskeletal: She exhibits no edema.    Imaging: Pelvic ultrasound dated 01/21/2016. Ultrasound demonstrated multiple fibroids. Uterus measured 10.3 x 9.0 x 6.5 cm. Endometrial thickness is 8 mm. Probable follicular cysts in the right ovary.   Labs:  CBC: No results for input(s): WBC, HGB, HCT, PLT in the last 8760 hours.  COAGS: No results for input(s): INR, APTT in the last 8760 hours.  BMP: No results for input(s): NA, K, CL, CO2, GLUCOSE, BUN, CALCIUM, CREATININE, GFRNONAA, GFRAA in the last 8760 hours.  Invalid input(s): CMP  LIVER FUNCTION TESTS: No results for input(s): BILITOT, AST, ALT, ALKPHOS, PROT, ALBUMIN in the last 8760 hours.  TUMOR MARKERS: No results for input(s): AFPTM, CEA, CA199, CHROMGRNA in the last 8760 hours.  Assessment and Plan:  46 year old female with uterine fibroids and menorrhagia. The heavy menstrual bleeding has been a problem for the past 6 months. Patient is seeking treatment for the heavy menstrual bleeding. Recent Pap smear and endometrial biopsy are negative for malignancy. We discussed uterine fibroids in depth. We discussed the different treatment options including hysterectomy, hormonal therapy and uterine artery embolization. I explained uterine artery embolization procedure in depth. We described the risks and benefits and the post procedure recovery and expectations. Patient has a very good understanding of the procedure. Patient understands that she would need an MRI prior to the procedure. She understands that pregnancy is not recommended  following the uterine artery embolization procedure. Patient seemed enthusiastic about the uterine artery embolization procedure and would like to proceed with the MRI to see if she is a candidate. Based on the patient's symptoms, physical exam and prior imaging, she is a good candidate for uterine artery embolization as long as there is not a contraindication based on MRI.  Plan to get MRI of the pelvis with and without contrast. If the patient is a candidate for the uterine artery embolization procedure, we will schedule procedure at her convenience.  Thank you for this interesting consult.  I greatly enjoyed meeting Eileen Larsen and look forward to participating in their care.  A copy of this report was sent to the requesting provider on this date.  Electronically Signed: Carylon Perches 05/06/2017, 11:28 AM   I spent a total of  30 Minutes   in face to face in clinical consultation, greater than 50% of which was counseling/coordinating care for uterine fibroids and menorrhagia.

## 2017-05-15 ENCOUNTER — Ambulatory Visit
Admission: RE | Admit: 2017-05-15 | Discharge: 2017-05-15 | Disposition: A | Discharge status: Home / Self Care | Payer: BLUE CROSS/BLUE SHIELD | Source: Ambulatory Visit | Attending: Obstetrics and Gynecology | Admitting: Obstetrics and Gynecology

## 2017-05-15 DIAGNOSIS — I10 Essential (primary) hypertension: Secondary | ICD-10-CM | POA: Diagnosis not present

## 2017-05-15 DIAGNOSIS — D250 Submucous leiomyoma of uterus: Secondary | ICD-10-CM

## 2017-05-15 DIAGNOSIS — D259 Leiomyoma of uterus, unspecified: Secondary | ICD-10-CM | POA: Diagnosis not present

## 2017-05-15 DIAGNOSIS — D649 Anemia, unspecified: Secondary | ICD-10-CM | POA: Diagnosis not present

## 2017-05-15 DIAGNOSIS — Z23 Encounter for immunization: Secondary | ICD-10-CM | POA: Diagnosis not present

## 2017-05-15 DIAGNOSIS — R55 Syncope and collapse: Secondary | ICD-10-CM | POA: Diagnosis not present

## 2017-05-15 DIAGNOSIS — Z0000 Encounter for general adult medical examination without abnormal findings: Secondary | ICD-10-CM | POA: Diagnosis not present

## 2017-05-15 DIAGNOSIS — E119 Type 2 diabetes mellitus without complications: Secondary | ICD-10-CM | POA: Diagnosis not present

## 2017-05-15 MED ORDER — GADOBENATE DIMEGLUMINE 529 MG/ML IV SOLN
20.0000 mL | Freq: Once | INTRAVENOUS | Status: AC | PRN
  Administered 2017-05-15: 17 mL via INTRAVENOUS

## 2017-05-21 ENCOUNTER — Other Ambulatory Visit (HOSPITAL_COMMUNITY): Payer: Self-pay | Admitting: Diagnostic Radiology

## 2017-05-21 DIAGNOSIS — D250 Submucous leiomyoma of uterus: Secondary | ICD-10-CM

## 2017-06-19 DIAGNOSIS — D649 Anemia, unspecified: Secondary | ICD-10-CM | POA: Diagnosis not present

## 2017-06-29 ENCOUNTER — Encounter: Payer: Self-pay | Admitting: Diagnostic Radiology

## 2017-07-08 ENCOUNTER — Other Ambulatory Visit: Payer: Self-pay | Admitting: General Surgery

## 2017-07-08 ENCOUNTER — Other Ambulatory Visit: Payer: Self-pay | Admitting: Radiology

## 2017-07-09 ENCOUNTER — Encounter (HOSPITAL_COMMUNITY): Payer: Self-pay

## 2017-07-09 ENCOUNTER — Observation Stay (HOSPITAL_COMMUNITY)
Admission: AD | Admit: 2017-07-09 | Discharge: 2017-07-10 | Disposition: A | Discharge status: Home / Self Care | Payer: BLUE CROSS/BLUE SHIELD | Source: Ambulatory Visit | Attending: Diagnostic Radiology | Admitting: Diagnostic Radiology

## 2017-07-09 ENCOUNTER — Ambulatory Visit (HOSPITAL_COMMUNITY)
Admission: RE | Admit: 2017-07-09 | Discharge: 2017-07-09 | Disposition: A | Discharge status: Home / Self Care | Payer: BLUE CROSS/BLUE SHIELD | Source: Ambulatory Visit | Attending: Diagnostic Radiology | Admitting: Diagnostic Radiology

## 2017-07-09 DIAGNOSIS — E785 Hyperlipidemia, unspecified: Secondary | ICD-10-CM | POA: Insufficient documentation

## 2017-07-09 DIAGNOSIS — I10 Essential (primary) hypertension: Secondary | ICD-10-CM | POA: Diagnosis not present

## 2017-07-09 DIAGNOSIS — N924 Excessive bleeding in the premenopausal period: Secondary | ICD-10-CM | POA: Diagnosis present

## 2017-07-09 DIAGNOSIS — N920 Excessive and frequent menstruation with regular cycle: Secondary | ICD-10-CM | POA: Diagnosis not present

## 2017-07-09 DIAGNOSIS — D259 Leiomyoma of uterus, unspecified: Principal | ICD-10-CM | POA: Insufficient documentation

## 2017-07-09 DIAGNOSIS — E119 Type 2 diabetes mellitus without complications: Secondary | ICD-10-CM | POA: Diagnosis not present

## 2017-07-09 DIAGNOSIS — D250 Submucous leiomyoma of uterus: Secondary | ICD-10-CM

## 2017-07-09 HISTORY — PX: IR EMBO TUMOR ORGAN ISCHEMIA INFARCT INC GUIDE ROADMAPPING: IMG5449

## 2017-07-09 HISTORY — PX: IR US GUIDE VASC ACCESS RIGHT: IMG2390

## 2017-07-09 HISTORY — PX: IR ANGIOGRAM SELECTIVE EACH ADDITIONAL VESSEL: IMG667

## 2017-07-09 HISTORY — PX: IR ANGIOGRAM PELVIS SELECTIVE OR SUPRASELECTIVE: IMG661

## 2017-07-09 LAB — BASIC METABOLIC PANEL
Anion gap: 11 (ref 5–15)
BUN: 11 mg/dL (ref 6–20)
CO2: 23 mmol/L (ref 22–32)
Calcium: 9 mg/dL (ref 8.9–10.3)
Chloride: 105 mmol/L (ref 101–111)
Creatinine, Ser: 0.77 mg/dL (ref 0.44–1.00)
GFR calc Af Amer: >60 mL/min (ref >60)
GFR calc non Af Amer: >60 mL/min (ref >60)
Glucose, Bld: 115 mg/dL — ABNORMAL HIGH (ref 65–99)
Potassium: 3.7 mmol/L (ref 3.5–5.1)
Sodium: 139 mmol/L (ref 135–145)

## 2017-07-09 LAB — CBC
HCT: 36.8 % (ref 36.0–46.0)
Hemoglobin: 12.1 g/dL (ref 12.0–15.0)
MCH: 22.9 pg — ABNORMAL LOW (ref 26.0–34.0)
MCHC: 32.9 g/dL (ref 30.0–36.0)
MCV: 69.7 fL — ABNORMAL LOW (ref 78.0–100.0)
Platelets: 334 10*3/uL (ref 150–400)
RBC: 5.28 MIL/uL — ABNORMAL HIGH (ref 3.87–5.11)
RDW: 17.6 % — ABNORMAL HIGH (ref 11.5–15.5)
WBC: 6.7 10*3/uL (ref 4.0–10.5)

## 2017-07-09 LAB — HCG, SERUM, QUALITATIVE: Preg, Serum: NEGATIVE

## 2017-07-09 LAB — GLUCOSE, CAPILLARY: Glucose-Capillary: 115 mg/dL — ABNORMAL HIGH (ref 65–99)

## 2017-07-09 LAB — APTT: aPTT: 25 seconds (ref 24–36)

## 2017-07-09 LAB — PROTIME-INR
INR: 0.96
Prothrombin Time: 12.7 seconds (ref 11.4–15.2)

## 2017-07-09 MED ORDER — FENTANYL CITRATE (PF) 100 MCG/2ML IJ SOLN
INTRAMUSCULAR | Status: AC | PRN
  Administered 2017-07-09 (×2): 50 ug via INTRAVENOUS

## 2017-07-09 MED ORDER — SODIUM CHLORIDE 0.9% FLUSH: 3.0000 mL | INTRAVENOUS | Status: DC | PRN

## 2017-07-09 MED ORDER — PROMETHAZINE HCL 25 MG PO TABS
25.0000 mg | ORAL_TABLET | Freq: Three times a day (TID) | ORAL | Status: DC | PRN
  Administered 2017-07-10: 25 mg via ORAL
  Filled 2017-07-09: qty 1

## 2017-07-09 MED ORDER — SODIUM CHLORIDE 0.9 % IV SOLN
INTRAVENOUS | Status: DC
  Administered 2017-07-09: 09:00:00 via INTRAVENOUS

## 2017-07-09 MED ORDER — SODIUM CHLORIDE 0.9 % IV SOLN
250.0000 mL | INTRAVENOUS | Status: DC | PRN
  Administered 2017-07-09: 250 mL via INTRAVENOUS

## 2017-07-09 MED ORDER — KETOROLAC TROMETHAMINE 30 MG/ML IJ SOLN
30.0000 mg | Freq: Once | INTRAMUSCULAR | Status: AC
  Administered 2017-07-09: 30 mg via INTRAVENOUS
  Filled 2017-07-09: qty 1

## 2017-07-09 MED ORDER — ONDANSETRON HCL 4 MG/2ML IJ SOLN
4.0000 mg | Freq: Four times a day (QID) | INTRAMUSCULAR | Status: DC | PRN
Start: 2017-07-09 — End: 2017-07-10
  Administered 2017-07-09: 4 mg via INTRAVENOUS
  Filled 2017-07-09: qty 2

## 2017-07-09 MED ORDER — HYDROMORPHONE 1 MG/ML IV SOLN
INTRAVENOUS | Status: DC
  Administered 2017-07-09: 0.9 mg via INTRAVENOUS
  Administered 2017-07-09: 5.1 mg via INTRAVENOUS
  Administered 2017-07-09: 3.6 mg via INTRAVENOUS
  Administered 2017-07-09: 12:00:00 via INTRAVENOUS
  Administered 2017-07-10: 1.5 mg via INTRAVENOUS
  Administered 2017-07-10: 2.1 mg via INTRAVENOUS
  Filled 2017-07-09: qty 25

## 2017-07-09 MED ORDER — CEFAZOLIN SODIUM-DEXTROSE 2-4 GM/100ML-% IV SOLN
INTRAVENOUS | Status: AC
  Administered 2017-07-09: 2 g via INTRAVENOUS
  Filled 2017-07-09: qty 100

## 2017-07-09 MED ORDER — MIDAZOLAM HCL 2 MG/2ML IJ SOLN
INTRAMUSCULAR | Status: AC | PRN
  Administered 2017-07-09 (×4): 1 mg via INTRAVENOUS

## 2017-07-09 MED ORDER — DIPHENHYDRAMINE HCL 12.5 MG/5ML PO ELIX
12.5000 mg | ORAL_SOLUTION | Freq: Four times a day (QID) | ORAL | Status: DC | PRN
  Filled 2017-07-09: qty 5

## 2017-07-09 MED ORDER — LIDOCAINE HCL (PF) 1 % IJ SOLN
INTRAMUSCULAR | Status: AC | PRN
  Administered 2017-07-09: 20 mL

## 2017-07-09 MED ORDER — MIDAZOLAM HCL 2 MG/2ML IJ SOLN
INTRAMUSCULAR | Status: AC
  Filled 2017-07-09: qty 6

## 2017-07-09 MED ORDER — DOCUSATE SODIUM 100 MG PO CAPS
100.0000 mg | ORAL_CAPSULE | Freq: Two times a day (BID) | ORAL | Status: DC
  Administered 2017-07-09 – 2017-07-10 (×2): 100 mg via ORAL
  Filled 2017-07-09 (×2): qty 1

## 2017-07-09 MED ORDER — NALOXONE HCL 0.4 MG/ML IJ SOLN: 0.4000 mg | INTRAMUSCULAR | Status: DC | PRN

## 2017-07-09 MED ORDER — FENTANYL CITRATE (PF) 100 MCG/2ML IJ SOLN
INTRAMUSCULAR | Status: AC
  Filled 2017-07-09: qty 4

## 2017-07-09 MED ORDER — SODIUM CHLORIDE 0.9% FLUSH: 9.0000 mL | INTRAVENOUS | Status: DC | PRN

## 2017-07-09 MED ORDER — IOPAMIDOL (ISOVUE-300) INJECTION 61%
INTRAVENOUS | Status: AC
  Administered 2017-07-09: 12 mL via INTRA_ARTERIAL
  Filled 2017-07-09: qty 200

## 2017-07-09 MED ORDER — IOPAMIDOL (ISOVUE-300) INJECTION 61%
100.0000 mL | Freq: Once | INTRAVENOUS | Status: AC | PRN
Start: 2017-07-09 — End: 2017-07-09
  Administered 2017-07-09: 70 mL via INTRA_ARTERIAL

## 2017-07-09 MED ORDER — KETOROLAC TROMETHAMINE 30 MG/ML IJ SOLN
30.0000 mg | Freq: Four times a day (QID) | INTRAMUSCULAR | Status: DC
  Administered 2017-07-09 – 2017-07-10 (×4): 30 mg via INTRAVENOUS
  Filled 2017-07-09 (×4): qty 1

## 2017-07-09 MED ORDER — IOPAMIDOL (ISOVUE-300) INJECTION 61%
100.0000 mL | Freq: Once | INTRAVENOUS | Status: AC | PRN
Start: 2017-07-09 — End: 2017-07-09
  Administered 2017-07-09: 12 mL via INTRA_ARTERIAL

## 2017-07-09 MED ORDER — LIDOCAINE HCL 1 % IJ SOLN
INTRAMUSCULAR | Status: AC
  Filled 2017-07-09: qty 20

## 2017-07-09 MED ORDER — DIPHENHYDRAMINE HCL 50 MG/ML IJ SOLN: 12.5000 mg | Freq: Four times a day (QID) | INTRAMUSCULAR | Status: DC | PRN

## 2017-07-09 MED ORDER — SODIUM CHLORIDE 0.9% FLUSH
3.0000 mL | Freq: Two times a day (BID) | INTRAVENOUS | Status: DC
  Administered 2017-07-10: 3 mL via INTRAVENOUS

## 2017-07-09 MED ORDER — PROMETHAZINE HCL 25 MG RE SUPP
25.0000 mg | Freq: Three times a day (TID) | RECTAL | Status: DC | PRN
  Administered 2017-07-09: 25 mg via RECTAL
  Filled 2017-07-09: qty 1

## 2017-07-09 MED ORDER — ONDANSETRON HCL 4 MG/2ML IJ SOLN
INTRAMUSCULAR | Status: AC
  Filled 2017-07-09: qty 2

## 2017-07-09 MED ORDER — ONDANSETRON HCL 4 MG/2ML IJ SOLN: 4.0000 mg | Freq: Four times a day (QID) | INTRAMUSCULAR | Status: DC | PRN

## 2017-07-09 MED ORDER — CEFAZOLIN SODIUM-DEXTROSE 2-4 GM/100ML-% IV SOLN
2.0000 g | INTRAVENOUS | Status: AC
  Administered 2017-07-09: 2 g via INTRAVENOUS

## 2017-07-09 NOTE — Progress Notes (Signed)
Patient is having quite a bit of cramping.  She did have some nausea immediately postoperatively.  Otherwise, no other new complaints.  PE: Abdomen: Soft, appropriately tender, nondistended Skin: Right CFA puncture site is clean, dry, and intact. Extremities: +2 pedal pulses in both feet.  Assessment and plan: 1.  Advance diet as tolerates 2 DC Foley catheter within the next couple of hours 3.  She may begin to sit up and mobilize. 4.  Continue PCA tonight and begin oral narcotics tomorrow for pain. 5.  Plan for discharge home tomorrow if she is doing well.  Eileen Larsen E 4:48 PM 07/09/2017

## 2017-07-09 NOTE — H&P (Signed)
Chief Complaint: uterine fibroids  Referring Physician:Dr. Servando Salina  Supervising Physician: Markus Daft  Patient Status: [facility] - Out-pt  HPI: Eileen Larsen is a 46 y.o. female who was referred to Dr. Anselm Pancoast for uterine fibroids and menorrhagia.  Please see his H&P for further details regarding her GYN history.  She was seen to discuss a [place].  She had a follow up MRI which determined she was a candidate for the procedure.  It was scheduled and she presents today for this procedure.  No new complaints or change in her medical history since she was last seen in the office.  Past Medical History:  Past Medical History:  Diagnosis Date  . Diabetes mellitus without complication ([place])   . Fibroids   . Hyperlipidemia   . Hypertension   . Kidney stone     Past Surgical History:  Past Surgical History:  Procedure Laterality Date  . IR RADIOLOGIST EVAL & MGMT  05/06/2017  . LITHOTRIPSY      Family History: History reviewed. No pertinent family history.  Social History:  reports that she has never smoked. She has never used smokeless tobacco. She reports that she drinks alcohol. She reports that she does not use drugs.  Allergies: No Known Allergies  Medications: Medications reviewed in epic  Please HPI for pertinent positives, otherwise complete 10 system ROS negative.  Mallampati Score: MD Evaluation Airway: WNL Heart: WNL Abdomen: WNL Chest/ Lungs: WNL ASA  Classification: 2 Mallampati/Airway Score: Two  Physical Exam: Wt 188 lb 9.6 oz (85.5 kg)   LMP 06/17/2017   BMI 29.98 kg/m  Body mass index is 29.98 kg/m. General: pleasant, WD, WN black female who is laying in bed in NAD HEENT: head is normocephalic, atraumatic.  Sclera are noninjected.  PERRL.  Ears and nose without any masses or lesions.  Mouth is pink and moist Heart: regular, rate, and rhythm.  Normal s1,s2. No obvious murmurs, gallops, or rubs noted.  Palpable radial and pedal pulses  bilaterally Lungs: CTAB, no wheezes, rhonchi, or rales noted.  Respiratory effort nonlabored Abd: soft, NT, ND, +BS, no masses, hernias, or organomegaly Psych: A&Ox3 with an appropriate affect.   Labs: Results for orders placed or performed during the hospital encounter of 07/09/17 (from the past 48 hour(s))  Glucose, capillary     Status: Abnormal   Collection Time: 07/09/17  7:57 AM  Result Value Ref Range   Glucose-Capillary 115 (H) 65 - 99 mg/dL   Comment 1 Notify RN     Imaging: No results found.  Assessment/Plan 1. Uterine fibroids with menorrhagia  The plan will be to proceed with a [place] today.  Her labs are currently in process. Her vitals have been reviewed.  Plans will be to admit her overnight and discharge her in the morning if she is doing well. Risks and benefits of [place] were discussed with the patient including, but not limited to bleeding, infection, vascular injury or contrast induced renal failure.  This interventional procedure involves the use of X-rays and because of the nature of the planned procedure, it is possible that we will have prolonged use of X-ray fluoroscopy.  Potential radiation risks to you include (but are not limited to) the following: - A slightly elevated risk for cancer  several years later in life. This risk is typically less than 0.5% percent. This risk is low in comparison to the normal incidence of human cancer, which is 33% for women and 50% for men according to  the [place]. - Radiation induced injury can include skin redness, resembling a rash, tissue breakdown / ulcers and hair loss (which can be temporary or permanent).   The likelihood of either of these occurring depends on the difficulty of the procedure and whether you are sensitive to radiation due to previous procedures, disease, or genetic conditions.   IF your procedure requires a prolonged use of radiation, you will be notified and given written instructions for  further action.  It is your responsibility to monitor the irradiated area for the 2 weeks following the procedure and to notify your physician if you are concerned that you have suffered a radiation induced injury.    All of the patient's questions were answered, patient is agreeable to proceed.  Consent signed and in chart.   Thank you for this interesting consult.  I greatly enjoyed meeting DELINA KRUCZEK and look forward to participating in their care.  A copy of this report was sent to the requesting provider on this date.  Electronically Signed: Henreitta Cea 07/09/2017, 8:51 AM   I spent a total of    25 Minutes in face to face in clinical consultation, greater than 50% of which was counseling/coordinating care for uterine fibroids

## 2017-07-09 NOTE — H&P (Deleted)
  The note originally documented on this encounter has been moved the the encounter in which it belongs.  

## 2017-07-09 NOTE — Procedures (Signed)
  Pre-operative Diagnosis: Menorrhagia and Uterine fibroids       Post-operative Diagnosis: Menorrhagia and Uterine fibroids   Indications: Menorrhagia and Uterine fibroids  Procedure: Bilateral uterine artery embolization  Findings: Successful embolization of bilateral uterine arteries  Complications: None     EBL: Minimal  Plan: Overnight observation for symptom management.

## 2017-07-10 ENCOUNTER — Other Ambulatory Visit: Payer: Self-pay | Admitting: Radiology

## 2017-07-10 DIAGNOSIS — I10 Essential (primary) hypertension: Secondary | ICD-10-CM | POA: Diagnosis not present

## 2017-07-10 DIAGNOSIS — D259 Leiomyoma of uterus, unspecified: Secondary | ICD-10-CM | POA: Diagnosis not present

## 2017-07-10 DIAGNOSIS — N920 Excessive and frequent menstruation with regular cycle: Secondary | ICD-10-CM | POA: Diagnosis not present

## 2017-07-10 DIAGNOSIS — E785 Hyperlipidemia, unspecified: Secondary | ICD-10-CM | POA: Diagnosis not present

## 2017-07-10 DIAGNOSIS — E119 Type 2 diabetes mellitus without complications: Secondary | ICD-10-CM | POA: Diagnosis not present

## 2017-07-10 DIAGNOSIS — D219 Benign neoplasm of connective and other soft tissue, unspecified: Secondary | ICD-10-CM | POA: Insufficient documentation

## 2017-07-10 DIAGNOSIS — N924 Excessive bleeding in the premenopausal period: Secondary | ICD-10-CM

## 2017-07-10 MED ORDER — PROMETHAZINE HCL 25 MG PO TABS: 25.0000 mg | ORAL_TABLET | Freq: Three times a day (TID) | ORAL | 0 refills | Status: DC | PRN

## 2017-07-10 MED ORDER — HYDROCODONE-ACETAMINOPHEN 5-325 MG PO TABS: 1.0000 | ORAL_TABLET | ORAL | 0 refills | Status: DC | PRN

## 2017-07-10 MED ORDER — IBUPROFEN 800 MG PO TABS: 800.0000 mg | ORAL_TABLET | Freq: Three times a day (TID) | ORAL | 0 refills | Status: AC

## 2017-07-10 MED ORDER — DOCUSATE SODIUM 100 MG PO CAPS: 100.0000 mg | ORAL_CAPSULE | Freq: Two times a day (BID) | ORAL | 0 refills | Status: DC

## 2017-07-10 MED ORDER — HYDROCODONE-ACETAMINOPHEN 5-325 MG PO TABS
1.0000 | ORAL_TABLET | ORAL | Status: DC | PRN
  Administered 2017-07-10: 1 via ORAL
  Filled 2017-07-10: qty 1
  Filled 2017-07-10: qty 2

## 2017-07-10 NOTE — Discharge Summary (Signed)
Patient ID: Eileen Larsen MRN: [id number] DOB/AGE: 03-10-1971 46 y.o.  Admit date: 07/09/2017 Discharge date: 07/10/2017  Supervising Physician: Aletta Edouard  Patient Status: [facility] - In-pt  Admission Diagnoses: Symptomatic uterine fibroids  Discharge Diagnoses:  Active Problems:   Menorrhagia, premenopausal   Discharged Condition: good  Hospital Course: Pt admitted on 10/26 after successful Uterine artery embolization. Pain control adequate. Pt has voided after foley removed. Tolerating diet once nausea resolved. Stable for discharge All medications, instructions, and follow up plans reviewed.  Discharge Exam: Blood pressure 137/81, pulse 80, temperature 98.1 F (36.7 C), temperature source Oral, resp. rate 15, height 5' 6.5" (1.689 m), weight 188 lb 14.4 oz (85.7 kg), last menstrual period 06/17/2017, SpO2 99 %. General: NAD Lungs: CTA without w/r/r Heart: Regular Abdomen: soft, ND, NT Ext:(R) groin soft, NT, no hematoma. 2+ distal pulses   Disposition: 01-Home or Self Care  Discharge Instructions    Call MD for:  difficulty breathing, headache or visual disturbances    Complete by:  As directed    Call MD for:  persistant nausea and vomiting    Complete by:  As directed    Call MD for:  redness, tenderness, or signs of infection (pain, swelling, redness, odor or green/yellow discharge around incision site)    Complete by:  As directed    Call MD for:  severe uncontrolled pain    Complete by:  As directed    Call MD for:  temperature >100.4    Complete by:  As directed    Diet - low sodium heart healthy    Complete by:  As directed    Driving Restrictions    Complete by:  As directed    No driving for 2-3 days   Increase activity slowly    Complete by:  As directed    May shower / Bathe    Complete by:  As directed    May walk up steps    Complete by:  As directed    Remove dressing in 24 hours    Complete by:  As directed      Allergies as of  07/10/2017   No Known Allergies     Medication List    TAKE these medications   amLODipine 5 MG tablet Commonly known as:  NORVASC Take 5 mg by mouth daily.   atorvastatin 20 MG tablet Commonly known as:  LIPITOR Take 20 mg by mouth daily.   diazepam 5 MG tablet Commonly known as:  VALIUM Take 1 tablet (5 mg total) by mouth 2 (two) times daily.   docusate sodium 100 MG capsule Commonly known as:  COLACE Take 1 capsule (100 mg total) by mouth 2 (two) times daily.   folic acid 1 MG tablet Commonly known as:  FOLVITE Take 1 mg by mouth daily.   hydrochlorothiazide 25 MG tablet Commonly known as:  HYDRODIURIL Take 25 mg by mouth daily.   HYDROcodone-acetaminophen 5-325 MG tablet Commonly known as:  NORCO/VICODIN Take 1 tablet by mouth every 4 (four) hours as needed for moderate pain or severe pain. What changed:  how much to take   ibuprofen 800 MG tablet Commonly known as:  ADVIL,MOTRIN Take 1 tablet (800 mg total) by mouth 3 (three) times daily. What changed:  medication strength  how much to take  when to take this  reasons to take this   metFORMIN 500 MG tablet Commonly known as:  GLUCOPHAGE Take 1,000 mg by mouth 2 (two) times  daily with a meal.   promethazine 25 MG tablet Commonly known as:  PHENERGAN Take 1 tablet (25 mg total) by mouth every 8 (eight) hours as needed for nausea.   trifluridine 1 % ophthalmic solution Commonly known as:  VIROPTIC Place 1 drop into the right eye every 2 (two) hours. Use drops 9 times per day      Follow-up Information    Markus Daft, MD. Go in 2 week(s).   Specialty:  Interventional Radiology Why:  Office will call you to schedule follow up appointment date/time Contact information: [address] [place] [address] [phone]            Electronically Signed: Ascencion Dike, PA-C 07/10/2017, 1:46 PM   I have spent Less Than 30 Minutes discharging Eileen Larsen.

## 2017-07-10 NOTE — Discharge Instructions (Signed)
Moderate Conscious Sedation, Adult, Care After These instructions provide you with information about caring for yourself after your procedure. Your health care provider may also give you more specific instructions. Your treatment has been planned according to current medical practices, but problems sometimes occur. Call your health care provider if you have any problems or questions after your procedure. What can I expect after the procedure? After your procedure, it is common:  To feel sleepy for several hours.  To feel clumsy and have poor balance for several hours.  To have poor judgment for several hours.  To vomit if you eat too soon.  Follow these instructions at home: For at least 24 hours after the procedure:   Do not: ? Participate in activities where you could fall or become injured. ? Drive. ? Use heavy machinery. ? Drink alcohol. ? Take sleeping pills or medicines that cause drowsiness. ? Make important decisions or sign legal documents. ? Take care of children on your own.  Rest. Eating and drinking  Follow the diet recommended by your health care provider.  If you vomit: ? Drink water, juice, or soup when you can drink without vomiting. ? Make sure you have little or no nausea before eating solid foods. General instructions  Have a responsible adult stay with you until you are awake and alert.  Take over-the-counter and prescription medicines only as told by your health care provider.  If you smoke, do not smoke without supervision.  Keep all follow-up visits as told by your health care provider. This is important. Contact a health care provider if:  You keep feeling nauseous or you keep vomiting.  You feel light-headed.  You develop a rash.  You have a fever. Get help right away if:  You have trouble breathing. This information is not intended to replace advice given to you by your health care provider. Make sure you discuss any questions you have  with your health care provider. Document Released: 06/22/2013 Document Revised: 02/04/2016 Document Reviewed: 12/22/2015 Elsevier Interactive Patient Education  2018 [place]. Uterine Artery Embolization for Fibroids Uterine artery embolization is a nonsurgical treatment to shrink fibroids. A thin plastic tube (catheter) is used to inject material that blocks off the blood supply to the fibroid, which causes the fibroid to shrink. Tell a health care provider about:  Any allergies you have.  All medicines you are taking, including vitamins, herbs, eye drops, creams, and over-the-counter medicines.  Any problems you or family members have had with anesthetic medicines.  Any blood disorders you have.  Any surgeries you have had.  Any medical conditions you have. What are the risks?  Injury to the uterus from decreased blood supply  Infection.  Blood infection (septicemia).  Lack of menstrual periods (amenorrhea).  Death of tissue cells (necrosis) around your bladder or vulva.  Development of a hole between organs or from an organ to the surface of your skin (fistula).  Blood clot in the legs (deep vein thrombosis) or lung (pulmonary embolus). What happens before the procedure?  Ask your health care provider about changing or stopping your regular medicines.  Do not take aspirin or blood thinners (anticoagulants) for 1 week before the surgery or as directed by your health care provider.  Do not eat or drink anything for 8 hours before the surgery or as directed by your health care provider.  Empty your bladder before the procedure begins. What happens during the procedure?  An IV tube will be placed into one  of your veins. This will be used to give you a sedative and pain medication (conscious sedation).  You will be given a medicine that numbs the area (local anesthetic).  A small cut will be made in your groin. A catheter is then inserted into the main artery of  your leg.  The catheter will be guided through the artery to your uterus. A series of images will be taken while dye is injected through the catheter in your groin. X-rays are taken at the same time. This is done to provide a road map of the blood supply to your uterus and fibroids.  Tiny plastic spheres, about the size of sand grains, will be injected through the catheter. Metal coils may be used to help block the artery. The particles will lodge in tiny branches of the uterine artery that supplies blood to the fibroids.  The procedure is repeated on the artery that supplies the other side of the uterus.  The catheter is then removed and pressure is held to stop any bleeding. No stitches are needed.  A dressing is then placed over the cut (incision). What happens after the procedure?  You will be taken to a recovery area where your progress will be monitored until you are awake, stable, and taking fluids well. If there are no other problems, you will then be moved to a regular hospital room.  You will be observed overnight in the hospital.  You will have cramping that should be controlled with pain medication. This information is not intended to replace advice given to you by your health care provider. Make sure you discuss any questions you have with your health care provider. Document Released: 11/17/2005 Document Revised: 02/07/2016 Document Reviewed: 03/17/2013 Elsevier Interactive Patient Education  [employer].

## 2017-07-10 NOTE — Progress Notes (Signed)
Wasted 14mg s of dilaudid PCA 1mg /ml concentration with Aloha Gell RN.

## 2017-08-05 ENCOUNTER — Ambulatory Visit
Admission: RE | Admit: 2017-08-05 | Discharge: 2017-08-05 | Disposition: A | Discharge status: Home / Self Care | Payer: BLUE CROSS/BLUE SHIELD | Source: Ambulatory Visit | Attending: Radiology | Admitting: Radiology

## 2017-08-05 DIAGNOSIS — D219 Benign neoplasm of connective and other soft tissue, unspecified: Secondary | ICD-10-CM

## 2017-08-05 DIAGNOSIS — Z9889 Other specified postprocedural states: Secondary | ICD-10-CM | POA: Diagnosis not present

## 2017-08-05 DIAGNOSIS — N924 Excessive bleeding in the premenopausal period: Secondary | ICD-10-CM

## 2017-08-05 DIAGNOSIS — N921 Excessive and frequent menstruation with irregular cycle: Secondary | ICD-10-CM | POA: Diagnosis not present

## 2017-08-05 DIAGNOSIS — D259 Leiomyoma of uterus, unspecified: Secondary | ICD-10-CM | POA: Diagnosis not present

## 2017-08-05 HISTORY — PX: IR RADIOLOGIST EVAL & MGMT: IMG5224

## 2017-08-05 NOTE — Progress Notes (Signed)
Chief Complaint: Patient was seen in consultation today for  Chief Complaint  Patient presents with  . Follow-up    1 mo follow up [place]     at the request of Bruning,Kevin  Referring Physician(s): Garwin Brothers, Sheronette  History of Present Illness: Eileen Larsen is a 46 y.o. female with history of uterine fibroids and menorrhagia.  Patient underwent uterine artery embolization procedure on 07/09/2017.  The postprocedure hospital course was unremarkable.  Patient was able to return to work approximately 1.5 weeks after the procedure.  She had a light period approximately 1 week after the procedure.  She is currently also having another menstrual period and on day 4.  Overall, she says the periods have been very light compared to the preprocedure menstrual flow.  Patient is very happy with the early results of the procedure.  Patient had some cramping the first week after the procedure which has resolved.  She also feels like she has less urinary frequency since the procedure.  Patient has no signs of infection and no evidence for suspicious vaginal discharge.  Past Medical History:  Diagnosis Date  . Diabetes mellitus without complication ([facility])   . Fibroids   . Hyperlipidemia   . Hypertension   . Kidney stone     Past Surgical History:  Procedure Laterality Date  . IR ANGIOGRAM PELVIS SELECTIVE OR SUPRASELECTIVE  07/09/2017  . IR ANGIOGRAM PELVIS SELECTIVE OR SUPRASELECTIVE  07/09/2017  . IR ANGIOGRAM SELECTIVE EACH ADDITIONAL VESSEL  07/09/2017  . IR ANGIOGRAM SELECTIVE EACH ADDITIONAL VESSEL  07/09/2017  . IR EMBO TUMOR ORGAN ISCHEMIA INFARCT INC GUIDE ROADMAPPING  07/09/2017  . IR RADIOLOGIST EVAL & MGMT  05/06/2017  . IR US GUIDE VASC ACCESS RIGHT  07/09/2017  . LITHOTRIPSY      Allergies: Patient has no known allergies.  Medications: Prior to Admission medications   Medication Sig Start Date End Date Taking? Authorizing Provider  amLODipine (NORVASC) 5 MG tablet  Take 5 mg by mouth daily.   Yes [provider]  atorvastatin (LIPITOR) 20 MG tablet Take 20 mg by mouth daily.   Yes [provider]  hydrochlorothiazide (HYDRODIURIL) 25 MG tablet Take 25 mg by mouth daily.   Yes [provider]  metFORMIN (GLUCOPHAGE) 500 MG tablet Take 1,000 mg by mouth 2 (two) times daily with a meal.    Yes [provider]  diazepam (VALIUM) 5 MG tablet Take 1 tablet (5 mg total) by mouth 2 (two) times daily. Patient not taking: Reported on 08/05/2017 09/23/14   Noe Gens, PA-C  docusate sodium (COLACE) 100 MG capsule Take 1 capsule (100 mg total) by mouth 2 (two) times daily. Patient not taking: Reported on 08/05/2017 07/10/17   Ascencion Dike, PA-C  folic acid (FOLVITE) 1 MG tablet Take 1 mg by mouth daily.    [provider]  HYDROcodone-acetaminophen (NORCO/VICODIN) 5-325 MG tablet Take 1 tablet by mouth every 4 (four) hours as needed for moderate pain or severe pain. Patient not taking: Reported on 08/05/2017 07/10/17   Ascencion Dike, PA-C  ibuprofen (ADVIL,MOTRIN) 800 MG tablet Take 1 tablet (800 mg total) by mouth 3 (three) times daily. Patient not taking: Reported on 08/05/2017 07/10/17   Ascencion Dike, PA-C  promethazine (PHENERGAN) 25 MG tablet Take 1 tablet (25 mg total) by mouth every 8 (eight) hours as needed for nausea. Patient not taking: Reported on 08/05/2017 07/10/17   Ascencion Dike, PA-C  trifluridine (VIROPTIC) 1 % ophthalmic solution Place  1 drop into the right eye every 2 (two) hours. Use drops 9 times per day Patient not taking: Reported on 05/06/2017 01/07/14   Malvin Johns, MD     No family history on file.  Social History   Socioeconomic History  . Marital status: Single    Spouse name: Not on file  . Number of children: Not on file  . Years of education: Not on file  . Highest education level: Not on file  Social Needs  . Financial resource strain: Not on file  . Food insecurity -  worry: Not on file  . Food insecurity - inability: Not on file  . Transportation needs - medical: Not on file  . Transportation needs - non-medical: Not on file  Occupational History  . Not on file  Tobacco Use  . Smoking status: Never Smoker  . Smokeless tobacco: Never Used  Substance and Sexual Activity  . Alcohol use: Yes    Comment: rare  . Drug use: No  . Sexual activity: Yes    Birth control/protection: Coitus interruptus  Other Topics Concern  . Not on file  Social History Narrative  . Not on file     Vital Signs: BP 133/84   Pulse 86   Temp 98.4 F (36.9 C) (Oral)   Resp 14   Ht 5\' 6"  (1.676 m)   Wt 182 lb (82.6 kg)   LMP 08/01/2017   SpO2 100%   BMI 29.38 kg/m   Physical Exam  Constitutional: She is oriented to person, place, and time. She appears well-developed and well-nourished. No distress.  Cardiovascular: Normal rate, regular rhythm, normal heart sounds and intact distal pulses.  Pulmonary/Chest: Effort normal and breath sounds normal.  Abdominal: Soft. She exhibits no distension.  Musculoskeletal: She exhibits no edema.  Right groin incision is well-healed.  No hematoma.  Palpable right common femoral artery pulse.  Neurological: She is alert and oriented to person, place, and time.       Imaging: Ir Angiogram Pelvis Selective Or Supraselective  Result Date: 07/09/2017 INDICATION: 46 year old with uterine fibroids and menorrhagia. EXAM: BILATERAL UTERINE ARTERY EMBOLIZATION MEDICATIONS: Ancef 2 g. The antibiotic was administered within 1 hour of the procedure ANESTHESIA/SEDATION: Fentanyl 100 mcg IV; Versed 4.0 mg IV Moderate Sedation Time:  78 minutes The patient was continuously monitored during the procedure by the interventional radiology nurse under my direct supervision. CONTRAST:  85 mL Omnipaque 300 FLUOROSCOPY TIME:  Fluoroscopy Time: 22 minutes 42 seconds (1379 mGy). COMPLICATIONS: None immediate. PROCEDURE: Informed consent was obtained  from the patient following explanation of the procedure, risks, benefits and alternatives. The patient understands, agrees and consents for the procedure. All questions were addressed. A time out was performed prior to the initiation of the procedure. Maximal barrier sterile technique utilized including caps, mask, sterile gowns, sterile gloves, large sterile drape, hand hygiene, and Betadine prep. The patient was placed supine on the interventional table. The patient had palpable pedal pulses. The right groin was prepped and draped in a sterile fashion. Maximal barrier sterile technique was utilized including caps, mask, sterile gowns, sterile gloves, sterile drape, hand hygiene and skin antiseptic. The skin was anesthetized 1% lidocaine. Using ultrasound guidance, 21 gauge needle was directed in the right common femoral artery and a micropuncture dilator set was placed. The vascular access was upsized to a 5-French vascular sheath. A Cobra catheter was used to cannulate the left common iliac artery and the left internal iliac artery. A series of arteriograms were  performed to identify the left uterine artery orfice. A Progreat microcatheter was advanced into the left uterine artery. One vial of Embospheres (500 - 700 micron) were injected through the microcatheter with fluoroscopic guidance. There was near complete stasis of the left uterine artery following administration of the particles. A Waltman's loop was formed using the Cobra catheter and the right internal iliac artery was cannulated. The right uterine artery was identified with contrast angiograms. The microcatheter was advanced into the right uterine artery and a series of angiograms were performed. 1.5 vials of Embospheres (500-700 micron) were injected through the microcatheter with fluoroscopic guidance. There was near complete stasis of the right uterine artery at the end of the procedure. The microcatheter was removed. The Cobra catheter was  straightened out over the aortic bifurcation and removed over a wire. Angiogram performed through the right groin sheath. The right groin vascular sheath was removed with an Exoseal closure device. There was hemostasis at the groin following sheath removal. FINDINGS: Large uterine arteries bilaterally. Near complete stasis of the bilateral uterine arteries following embolization. Fluoroscopic images were obtained for documentation. IMPRESSION: Successful uterine artery embolization procedure. Electronically Signed   By: Markus Daft M.D.   On: 07/09/2017 14:20   Ir Angiogram Pelvis Selective Or Supraselective  Result Date: 07/09/2017 INDICATION: 46 year old with uterine fibroids and menorrhagia. EXAM: BILATERAL UTERINE ARTERY EMBOLIZATION MEDICATIONS: Ancef 2 g. The antibiotic was administered within 1 hour of the procedure ANESTHESIA/SEDATION: Fentanyl 100 mcg IV; Versed 4.0 mg IV Moderate Sedation Time:  78 minutes The patient was continuously monitored during the procedure by the interventional radiology nurse under my direct supervision. CONTRAST:  85 mL Omnipaque 300 FLUOROSCOPY TIME:  Fluoroscopy Time: 22 minutes 42 seconds (1379 mGy). COMPLICATIONS: None immediate. PROCEDURE: Informed consent was obtained from the patient following explanation of the procedure, risks, benefits and alternatives. The patient understands, agrees and consents for the procedure. All questions were addressed. A time out was performed prior to the initiation of the procedure. Maximal barrier sterile technique utilized including caps, mask, sterile gowns, sterile gloves, large sterile drape, hand hygiene, and Betadine prep. The patient was placed supine on the interventional table. The patient had palpable pedal pulses. The right groin was prepped and draped in a sterile fashion. Maximal barrier sterile technique was utilized including caps, mask, sterile gowns, sterile gloves, sterile drape, hand hygiene and skin antiseptic. The  skin was anesthetized 1% lidocaine. Using ultrasound guidance, 21 gauge needle was directed in the right common femoral artery and a micropuncture dilator set was placed. The vascular access was upsized to a 5-French vascular sheath. A Cobra catheter was used to cannulate the left common iliac artery and the left internal iliac artery. A series of arteriograms were performed to identify the left uterine artery orfice. A Progreat microcatheter was advanced into the left uterine artery. One vial of Embospheres (500 - 700 micron) were injected through the microcatheter with fluoroscopic guidance. There was near complete stasis of the left uterine artery following administration of the particles. A Waltman's loop was formed using the Cobra catheter and the right internal iliac artery was cannulated. The right uterine artery was identified with contrast angiograms. The microcatheter was advanced into the right uterine artery and a series of angiograms were performed. 1.5 vials of Embospheres (500-700 micron) were injected through the microcatheter with fluoroscopic guidance. There was near complete stasis of the right uterine artery at the end of the procedure. The microcatheter was removed. The Cobra catheter was straightened out  over the aortic bifurcation and removed over a wire. Angiogram performed through the right groin sheath. The right groin vascular sheath was removed with an Exoseal closure device. There was hemostasis at the groin following sheath removal. FINDINGS: Large uterine arteries bilaterally. Near complete stasis of the bilateral uterine arteries following embolization. Fluoroscopic images were obtained for documentation. IMPRESSION: Successful uterine artery embolization procedure. Electronically Signed   By: Markus Daft M.D.   On: 07/09/2017 14:20   Ir Angiogram Selective Each Additional Vessel  Result Date: 07/09/2017 INDICATION: 46 year old with uterine fibroids and menorrhagia. EXAM:  BILATERAL UTERINE ARTERY EMBOLIZATION MEDICATIONS: Ancef 2 g. The antibiotic was administered within 1 hour of the procedure ANESTHESIA/SEDATION: Fentanyl 100 mcg IV; Versed 4.0 mg IV Moderate Sedation Time:  78 minutes The patient was continuously monitored during the procedure by the interventional radiology nurse under my direct supervision. CONTRAST:  85 mL Omnipaque 300 FLUOROSCOPY TIME:  Fluoroscopy Time: 22 minutes 42 seconds (1379 mGy). COMPLICATIONS: None immediate. PROCEDURE: Informed consent was obtained from the patient following explanation of the procedure, risks, benefits and alternatives. The patient understands, agrees and consents for the procedure. All questions were addressed. A time out was performed prior to the initiation of the procedure. Maximal barrier sterile technique utilized including caps, mask, sterile gowns, sterile gloves, large sterile drape, hand hygiene, and Betadine prep. The patient was placed supine on the interventional table. The patient had palpable pedal pulses. The right groin was prepped and draped in a sterile fashion. Maximal barrier sterile technique was utilized including caps, mask, sterile gowns, sterile gloves, sterile drape, hand hygiene and skin antiseptic. The skin was anesthetized 1% lidocaine. Using ultrasound guidance, 21 gauge needle was directed in the right common femoral artery and a micropuncture dilator set was placed. The vascular access was upsized to a 5-French vascular sheath. A Cobra catheter was used to cannulate the left common iliac artery and the left internal iliac artery. A series of arteriograms were performed to identify the left uterine artery orfice. A Progreat microcatheter was advanced into the left uterine artery. One vial of Embospheres (500 - 700 micron) were injected through the microcatheter with fluoroscopic guidance. There was near complete stasis of the left uterine artery following administration of the particles. A Waltman's  loop was formed using the Cobra catheter and the right internal iliac artery was cannulated. The right uterine artery was identified with contrast angiograms. The microcatheter was advanced into the right uterine artery and a series of angiograms were performed. 1.5 vials of Embospheres (500-700 micron) were injected through the microcatheter with fluoroscopic guidance. There was near complete stasis of the right uterine artery at the end of the procedure. The microcatheter was removed. The Cobra catheter was straightened out over the aortic bifurcation and removed over a wire. Angiogram performed through the right groin sheath. The right groin vascular sheath was removed with an Exoseal closure device. There was hemostasis at the groin following sheath removal. FINDINGS: Large uterine arteries bilaterally. Near complete stasis of the bilateral uterine arteries following embolization. Fluoroscopic images were obtained for documentation. IMPRESSION: Successful uterine artery embolization procedure. Electronically Signed   By: Markus Daft M.D.   On: 07/09/2017 14:20   Ir Angiogram Selective Each Additional Vessel  Result Date: 07/09/2017 INDICATION: 46 year old with uterine fibroids and menorrhagia. EXAM: BILATERAL UTERINE ARTERY EMBOLIZATION MEDICATIONS: Ancef 2 g. The antibiotic was administered within 1 hour of the procedure ANESTHESIA/SEDATION: Fentanyl 100 mcg IV; Versed 4.0 mg IV Moderate Sedation Time:  78 minutes  The patient was continuously monitored during the procedure by the interventional radiology nurse under my direct supervision. CONTRAST:  85 mL Omnipaque 300 FLUOROSCOPY TIME:  Fluoroscopy Time: 22 minutes 42 seconds (1379 mGy). COMPLICATIONS: None immediate. PROCEDURE: Informed consent was obtained from the patient following explanation of the procedure, risks, benefits and alternatives. The patient understands, agrees and consents for the procedure. All questions were addressed. A time out was  performed prior to the initiation of the procedure. Maximal barrier sterile technique utilized including caps, mask, sterile gowns, sterile gloves, large sterile drape, hand hygiene, and Betadine prep. The patient was placed supine on the interventional table. The patient had palpable pedal pulses. The right groin was prepped and draped in a sterile fashion. Maximal barrier sterile technique was utilized including caps, mask, sterile gowns, sterile gloves, sterile drape, hand hygiene and skin antiseptic. The skin was anesthetized 1% lidocaine. Using ultrasound guidance, 21 gauge needle was directed in the right common femoral artery and a micropuncture dilator set was placed. The vascular access was upsized to a 5-French vascular sheath. A Cobra catheter was used to cannulate the left common iliac artery and the left internal iliac artery. A series of arteriograms were performed to identify the left uterine artery orfice. A Progreat microcatheter was advanced into the left uterine artery. One vial of Embospheres (500 - 700 micron) were injected through the microcatheter with fluoroscopic guidance. There was near complete stasis of the left uterine artery following administration of the particles. A Waltman's loop was formed using the Cobra catheter and the right internal iliac artery was cannulated. The right uterine artery was identified with contrast angiograms. The microcatheter was advanced into the right uterine artery and a series of angiograms were performed. 1.5 vials of Embospheres (500-700 micron) were injected through the microcatheter with fluoroscopic guidance. There was near complete stasis of the right uterine artery at the end of the procedure. The microcatheter was removed. The Cobra catheter was straightened out over the aortic bifurcation and removed over a wire. Angiogram performed through the right groin sheath. The right groin vascular sheath was removed with an Exoseal closure device. There  was hemostasis at the groin following sheath removal. FINDINGS: Large uterine arteries bilaterally. Near complete stasis of the bilateral uterine arteries following embolization. Fluoroscopic images were obtained for documentation. IMPRESSION: Successful uterine artery embolization procedure. Electronically Signed   By: Markus Daft M.D.   On: 07/09/2017 14:20   Ir US Guide Vasc Access Right  Result Date: 07/09/2017 INDICATION: 46 year old with uterine fibroids and menorrhagia. EXAM: BILATERAL UTERINE ARTERY EMBOLIZATION MEDICATIONS: Ancef 2 g. The antibiotic was administered within 1 hour of the procedure ANESTHESIA/SEDATION: Fentanyl 100 mcg IV; Versed 4.0 mg IV Moderate Sedation Time:  78 minutes The patient was continuously monitored during the procedure by the interventional radiology nurse under my direct supervision. CONTRAST:  85 mL Omnipaque 300 FLUOROSCOPY TIME:  Fluoroscopy Time: 22 minutes 42 seconds (1379 mGy). COMPLICATIONS: None immediate. PROCEDURE: Informed consent was obtained from the patient following explanation of the procedure, risks, benefits and alternatives. The patient understands, agrees and consents for the procedure. All questions were addressed. A time out was performed prior to the initiation of the procedure. Maximal barrier sterile technique utilized including caps, mask, sterile gowns, sterile gloves, large sterile drape, hand hygiene, and Betadine prep. The patient was placed supine on the interventional table. The patient had palpable pedal pulses. The right groin was prepped and draped in a sterile fashion. Maximal barrier sterile technique was utilized  including caps, mask, sterile gowns, sterile gloves, sterile drape, hand hygiene and skin antiseptic. The skin was anesthetized 1% lidocaine. Using ultrasound guidance, 21 gauge needle was directed in the right common femoral artery and a micropuncture dilator set was placed. The vascular access was upsized to a 5-French  vascular sheath. A Cobra catheter was used to cannulate the left common iliac artery and the left internal iliac artery. A series of arteriograms were performed to identify the left uterine artery orfice. A Progreat microcatheter was advanced into the left uterine artery. One vial of Embospheres (500 - 700 micron) were injected through the microcatheter with fluoroscopic guidance. There was near complete stasis of the left uterine artery following administration of the particles. A Waltman's loop was formed using the Cobra catheter and the right internal iliac artery was cannulated. The right uterine artery was identified with contrast angiograms. The microcatheter was advanced into the right uterine artery and a series of angiograms were performed. 1.5 vials of Embospheres (500-700 micron) were injected through the microcatheter with fluoroscopic guidance. There was near complete stasis of the right uterine artery at the end of the procedure. The microcatheter was removed. The Cobra catheter was straightened out over the aortic bifurcation and removed over a wire. Angiogram performed through the right groin sheath. The right groin vascular sheath was removed with an Exoseal closure device. There was hemostasis at the groin following sheath removal. FINDINGS: Large uterine arteries bilaterally. Near complete stasis of the bilateral uterine arteries following embolization. Fluoroscopic images were obtained for documentation. IMPRESSION: Successful uterine artery embolization procedure. Electronically Signed   By: Markus Daft M.D.   On: 07/09/2017 14:20   Ir Embo Tumor Organ Ischemia Infarct Inc Guide Roadmapping  Result Date: 07/09/2017 INDICATION: 46 year old with uterine fibroids and menorrhagia. EXAM: BILATERAL UTERINE ARTERY EMBOLIZATION MEDICATIONS: Ancef 2 g. The antibiotic was administered within 1 hour of the procedure ANESTHESIA/SEDATION: Fentanyl 100 mcg IV; Versed 4.0 mg IV Moderate Sedation Time:  78  minutes The patient was continuously monitored during the procedure by the interventional radiology nurse under my direct supervision. CONTRAST:  85 mL Omnipaque 300 FLUOROSCOPY TIME:  Fluoroscopy Time: 22 minutes 42 seconds (1379 mGy). COMPLICATIONS: None immediate. PROCEDURE: Informed consent was obtained from the patient following explanation of the procedure, risks, benefits and alternatives. The patient understands, agrees and consents for the procedure. All questions were addressed. A time out was performed prior to the initiation of the procedure. Maximal barrier sterile technique utilized including caps, mask, sterile gowns, sterile gloves, large sterile drape, hand hygiene, and Betadine prep. The patient was placed supine on the interventional table. The patient had palpable pedal pulses. The right groin was prepped and draped in a sterile fashion. Maximal barrier sterile technique was utilized including caps, mask, sterile gowns, sterile gloves, sterile drape, hand hygiene and skin antiseptic. The skin was anesthetized 1% lidocaine. Using ultrasound guidance, 21 gauge needle was directed in the right common femoral artery and a micropuncture dilator set was placed. The vascular access was upsized to a 5-French vascular sheath. A Cobra catheter was used to cannulate the left common iliac artery and the left internal iliac artery. A series of arteriograms were performed to identify the left uterine artery orfice. A Progreat microcatheter was advanced into the left uterine artery. One vial of Embospheres (500 - 700 micron) were injected through the microcatheter with fluoroscopic guidance. There was near complete stasis of the left uterine artery following administration of the particles. A Waltman's loop was formed  using the Cobra catheter and the right internal iliac artery was cannulated. The right uterine artery was identified with contrast angiograms. The microcatheter was advanced into the right uterine  artery and a series of angiograms were performed. 1.5 vials of Embospheres (500-700 micron) were injected through the microcatheter with fluoroscopic guidance. There was near complete stasis of the right uterine artery at the end of the procedure. The microcatheter was removed. The Cobra catheter was straightened out over the aortic bifurcation and removed over a wire. Angiogram performed through the right groin sheath. The right groin vascular sheath was removed with an Exoseal closure device. There was hemostasis at the groin following sheath removal. FINDINGS: Large uterine arteries bilaterally. Near complete stasis of the bilateral uterine arteries following embolization. Fluoroscopic images were obtained for documentation. IMPRESSION: Successful uterine artery embolization procedure. Electronically Signed   By: Markus Daft M.D.   On: 07/09/2017 14:20    Labs:  CBC: Recent Labs    07/09/17 0836  WBC 6.7  HGB 12.1  HCT 36.8  PLT 334    COAGS: Recent Labs    07/09/17 0836  INR 0.96  APTT 25    BMP: Recent Labs    07/09/17 0836  NA 139  K 3.7  CL 105  CO2 23  GLUCOSE 115*  BUN 11  CALCIUM 9.0  CREATININE 0.77  GFRNONAA >60  GFRAA >60    LIVER FUNCTION TESTS: No results for input(s): BILITOT, AST, ALT, ALKPHOS, PROT, ALBUMIN in the last 8760 hours.  TUMOR MARKERS: No results for input(s): AFPTM, CEA, CA199, CHROMGRNA in the last 8760 hours.   Assessment and Plan:  46 year old with history of uterine fibroids and menorrhagia.  Patient is doing very well following the uterine artery embolization procedure.  Her menstrual bleeding has been much lighter since the procedure and she has less urinary frequency.  Overall, the patient is very happy with the early results.  Plan for follow-up pelvic MRI in 6 months.  Patient will contact us in the interim if she has any questions or concerns.  Thank you for this interesting consult.  I greatly enjoyed meeting Eileen Larsen  and look forward to participating in their care.  A copy of this report was sent to the requesting provider on this date.  Electronically Signed: Burman Riis 08/05/2017, 9:44 AM   I spent a total of    15 Minutes in face to face in clinical consultation, greater than 50% of which was counseling/coordinating care for uterine fibroids.    Patient ID: Eileen Larsen, female   DOB: 20-Nov-1970, 46 y.o.   MRN: [id number]

## 2017-08-25 ENCOUNTER — Encounter: Payer: Self-pay | Admitting: Diagnostic Radiology

## 2017-11-16 DIAGNOSIS — E785 Hyperlipidemia, unspecified: Secondary | ICD-10-CM | POA: Diagnosis not present

## 2017-11-16 DIAGNOSIS — E119 Type 2 diabetes mellitus without complications: Secondary | ICD-10-CM | POA: Diagnosis not present

## 2017-11-16 DIAGNOSIS — D649 Anemia, unspecified: Secondary | ICD-10-CM | POA: Diagnosis not present

## 2017-11-16 DIAGNOSIS — I10 Essential (primary) hypertension: Secondary | ICD-10-CM | POA: Diagnosis not present

## 2017-12-07 ENCOUNTER — Other Ambulatory Visit: Payer: Self-pay | Admitting: Obstetrics and Gynecology

## 2017-12-07 ENCOUNTER — Other Ambulatory Visit (HOSPITAL_COMMUNITY): Payer: Self-pay | Admitting: Obstetrics and Gynecology

## 2017-12-07 ENCOUNTER — Other Ambulatory Visit (HOSPITAL_COMMUNITY): Payer: Self-pay | Admitting: Diagnostic Radiology

## 2017-12-07 ENCOUNTER — Other Ambulatory Visit: Payer: Self-pay | Admitting: Radiology

## 2017-12-07 DIAGNOSIS — D219 Benign neoplasm of connective and other soft tissue, unspecified: Secondary | ICD-10-CM

## 2017-12-31 ENCOUNTER — Ambulatory Visit
Admission: RE | Admit: 2017-12-31 | Discharge: 2017-12-31 | Disposition: A | Discharge status: Home / Self Care | Payer: BLUE CROSS/BLUE SHIELD | Source: Ambulatory Visit | Attending: Diagnostic Radiology | Admitting: Diagnostic Radiology

## 2017-12-31 ENCOUNTER — Ambulatory Visit (HOSPITAL_COMMUNITY)
Admission: RE | Admit: 2017-12-31 | Discharge: 2017-12-31 | Disposition: A | Discharge status: Home / Self Care | Payer: BLUE CROSS/BLUE SHIELD | Source: Ambulatory Visit | Attending: Diagnostic Radiology | Admitting: Diagnostic Radiology

## 2017-12-31 DIAGNOSIS — D219 Benign neoplasm of connective and other soft tissue, unspecified: Secondary | ICD-10-CM | POA: Insufficient documentation

## 2017-12-31 DIAGNOSIS — N83201 Unspecified ovarian cyst, right side: Secondary | ICD-10-CM | POA: Insufficient documentation

## 2017-12-31 DIAGNOSIS — N83202 Unspecified ovarian cyst, left side: Secondary | ICD-10-CM | POA: Diagnosis not present

## 2017-12-31 DIAGNOSIS — D259 Leiomyoma of uterus, unspecified: Secondary | ICD-10-CM | POA: Diagnosis not present

## 2017-12-31 HISTORY — PX: IR RADIOLOGIST EVAL & MGMT: IMG5224

## 2017-12-31 LAB — POCT I-STAT CREATININE: Creatinine, Ser: 0.9 mg/dL (ref 0.44–1.00)

## 2017-12-31 MED ORDER — GADOBENATE DIMEGLUMINE 529 MG/ML IV SOLN
20.0000 mL | Freq: Once | INTRAVENOUS | Status: AC | PRN
  Administered 2017-12-31: 17 mL via INTRAVENOUS

## 2017-12-31 NOTE — Progress Notes (Signed)
Chief Complaint: Patient was seen in consultation today for  Chief Complaint  Patient presents with  . Follow-up    6 mo follow up [place]     at the request of Dantae Meunier  Referring Physician(s): Garwin Brothers, Sheronette  History of Present Illness: Eileen Larsen is a 47 y.o. female with history of uterine fibroids and menorrhagia.  Patient underwent bilateral uterine artery embolization procedure on 07/09/2017.  She presents for her six-month follow-up.  Patient is very happy with the results of the procedure.  Her menstrual bleeding has markedly decreased since the procedure.  She continues to have irregular periods but no more than one per month.  Previously, she was bleeding for approximately 5 days now it is between 3-5 days.  She denies any cramping at this time.  She feels that the bloating and bulk symptoms in the abdomen have gotten much better.  Previously, she was having urinary frequency which has resolved.  She has no bowel issues.  She denies vaginal discharge.  She denies dyspareunia.   Past Medical History:  Diagnosis Date  . Diabetes mellitus without complication ([place])   . Fibroids   . Hyperlipidemia   . Hypertension   . Kidney stone     Past Surgical History:  Procedure Laterality Date  . IR ANGIOGRAM PELVIS SELECTIVE OR SUPRASELECTIVE  07/09/2017  . IR ANGIOGRAM PELVIS SELECTIVE OR SUPRASELECTIVE  07/09/2017  . IR ANGIOGRAM SELECTIVE EACH ADDITIONAL VESSEL  07/09/2017  . IR ANGIOGRAM SELECTIVE EACH ADDITIONAL VESSEL  07/09/2017  . IR EMBO TUMOR ORGAN ISCHEMIA INFARCT INC GUIDE ROADMAPPING  07/09/2017  . IR RADIOLOGIST EVAL & MGMT  05/06/2017  . IR RADIOLOGIST EVAL & MGMT  08/05/2017  . IR US GUIDE VASC ACCESS RIGHT  07/09/2017  . LITHOTRIPSY      Allergies: Patient has no known allergies.  Medications: Prior to Admission medications   Medication Sig Start Date End Date Taking? Authorizing Provider  amLODipine (NORVASC) 5 MG tablet Take 5 mg by mouth  daily.   Yes [provider]  atorvastatin (LIPITOR) 20 MG tablet Take 20 mg by mouth daily.   Yes [provider]  hydrochlorothiazide (HYDRODIURIL) 25 MG tablet Take 25 mg by mouth daily.   Yes [provider]  metFORMIN (GLUCOPHAGE) 500 MG tablet Take 1,000 mg by mouth 2 (two) times daily with a meal.    Yes [provider]  diazepam (VALIUM) 5 MG tablet Take 1 tablet (5 mg total) by mouth 2 (two) times daily. Patient not taking: Reported on 08/05/2017 09/23/14   Noe Gens, PA-C  docusate sodium (COLACE) 100 MG capsule Take 1 capsule (100 mg total) by mouth 2 (two) times daily. Patient not taking: Reported on 08/05/2017 07/10/17   Ascencion Dike, PA-C  folic acid (FOLVITE) 1 MG tablet Take 1 mg by mouth daily.    [provider]  HYDROcodone-acetaminophen (NORCO/VICODIN) 5-325 MG tablet Take 1 tablet by mouth every 4 (four) hours as needed for moderate pain or severe pain. Patient not taking: Reported on 08/05/2017 07/10/17   Ascencion Dike, PA-C  ibuprofen (ADVIL,MOTRIN) 800 MG tablet Take 1 tablet (800 mg total) by mouth 3 (three) times daily. Patient not taking: Reported on 08/05/2017 07/10/17   Ascencion Dike, PA-C  promethazine (PHENERGAN) 25 MG tablet Take 1 tablet (25 mg total) by mouth every 8 (eight) hours as needed for nausea. Patient not taking: Reported on 08/05/2017 07/10/17   Ascencion Dike, PA-C  trifluridine (VIROPTIC) 1 % ophthalmic  solution Place 1 drop into the right eye every 2 (two) hours. Use drops 9 times per day Patient not taking: Reported on 05/06/2017 01/07/14   Malvin Johns, MD     No family history on file.  Social History   Socioeconomic History  . Marital status: Married    Spouse name: Not on file  . Number of children: Not on file  . Years of education: Not on file  . Highest education level: Not on file  Occupational History  . Not on file  Social Needs  . Financial resource strain: Not on file  .  Food insecurity:    Worry: Not on file    Inability: Not on file  . Transportation needs:    Medical: Not on file    Non-medical: Not on file  Tobacco Use  . Smoking status: Never Smoker  . Smokeless tobacco: Never Used  Substance and Sexual Activity  . Alcohol use: Yes    Comment: rare  . Drug use: No  . Sexual activity: Yes    Birth control/protection: Coitus interruptus  Lifestyle  . Physical activity:    Days per week: Not on file    Minutes per session: Not on file  . Stress: Not on file  Relationships  . Social connections:    Talks on phone: Not on file    Gets together: Not on file    Attends religious service: Not on file    Active member of club or organization: Not on file    Attends meetings of clubs or organizations: Not on file    Relationship status: Not on file  Other Topics Concern  . Not on file  Social History Narrative  . Not on file      Review of Systems  Constitutional: Negative.   Genitourinary: Negative for difficulty urinating, dyspareunia, frequency and vaginal pain.    Vital Signs: BP (!) 146/94   Pulse 94   Temp 98.6 F (37 C) (Oral)   Resp 14   Ht 5\' 6"  (1.676 m)   Wt 180 lb (81.6 kg)   LMP 12/17/2017 (Approximate)   SpO2 98%   BMI 29.05 kg/m   Physical Exam  Constitutional: No distress.  Abdominal: Soft. She exhibits no distension. There is no tenderness.      Imaging: Mr Pelvis W Wo Contrast  Result Date: 12/31/2017 CLINICAL DATA:  Fibroids. Follow-up status post uterine artery embolization. EXAM: MRI PELVIS WITHOUT AND WITH CONTRAST TECHNIQUE: Multiplanar multisequence MR imaging of the pelvis was performed both before and after administration of intravenous contrast. CONTRAST:  83mL MULTIHANCE GADOBENATE DIMEGLUMINE 529 MG/ML IV SOLN COMPARISON:  05/15/2017 FINDINGS: Urinary Tract: No urinary bladder or urethral abnormality. Bowel: Unremarkable pelvic bowel loops. Vascular/Lymphatic: Unremarkable. No pathologically  enlarged pelvic lymph nodes identified. Reproductive: -- Uterus: Measures 9.6 x 5.4 x 9.1 cm (volume = 250 cm^3). Multiple fibroids are again seen, but no longer show evidence of contrast enhancement. These fibroids show mild decrease in size, with largest in the right lateral corpus measuring 4.8 cm image 19/4, compared to 5.9 cm previously. No new or enlarging uterine masses identified. Unremarkable appearance of cervix and vagina. -- Right ovary: 3.3 cm simple right ovarian cyst with benign characteristics. -- Left ovary: 2.9 cm simple left ovarian cyst with benign characteristics. Other: Tiny amount of free fluid, likely physiologic. Musculoskeletal:  Unremarkable. IMPRESSION: Mild decrease in overall uterine volume and size of individual fibroids since previous study. No new or enlarging uterine mass. Benign-appearing  bilateral ovarian cysts, most likely physiologic in a reproductive age female. Electronically Signed   By: Earle Gell M.D.   On: 12/31/2017 09:57    Labs:  CBC: Recent Labs    07/09/17 0836  WBC 6.7  HGB 12.1  HCT 36.8  PLT 334    COAGS: Recent Labs    07/09/17 0836  INR 0.96  APTT 25    BMP: Recent Labs    07/09/17 0836 12/31/17 0723  NA 139  --   K 3.7  --   CL 105  --   CO2 23  --   GLUCOSE 115*  --   BUN 11  --   CALCIUM 9.0  --   CREATININE 0.77 0.90  GFRNONAA >60  --   GFRAA >60  --     LIVER FUNCTION TESTS: No results for input(s): BILITOT, AST, ALT, ALKPHOS, PROT, ALBUMIN in the last 8760 hours.  TUMOR MARKERS: No results for input(s): AFPTM, CEA, CA199, CHROMGRNA in the last 8760 hours.  Assessment and Plan:  47 year old with history of uterine fibroids and menorrhagia and status post uterine artery embolization procedure.  Patient is very happy with the results of the procedure.  Her menstrual bleeding has markedly decreased and her abdominal cramps have resolved.  Her bulk symptoms including urinary frequency have also improved or  resolved.  I reviewed the patient's pelvic MRI which was performed earlier today.  The large uterine fibroids are all devascularized.  Minimal change in the size of the uterus or individual fibroids.  There is one tiny exophytic fibroid along the anterior uterus that still shows enhancement.  Evidence of bilateral simple ovarian cysts.  Went over the MRI findings with the patient.  Patient has no plans for future pregnancies but reiterated that it is not recommended following uterine artery embolization procedure. Patient will continue with her routine gynecology follow-ups.  No anticipated follow-up with Interventional Radiology.  Patient will contact us if she has any questions or concerns.  Thank you for this interesting consult.  I greatly enjoyed meeting Eileen Larsen and look forward to participating in their care.  A copy of this report was sent to the requesting provider on this date.  Electronically Signed: Burman Riis 12/31/2017, 1:40 PM   I spent a total of    10 Minutes in face to face in clinical consultation, greater than 50% of which was counseling/coordinating care for uterine fibroids.   Patient ID: Eileen Larsen, female   DOB: 04-11-1971, 47 y.o.   MRN: [id number]

## 2018-01-04 ENCOUNTER — Encounter: Payer: Self-pay | Admitting: *Deleted

## 2018-05-14 DIAGNOSIS — H2513 Age-related nuclear cataract, bilateral: Secondary | ICD-10-CM | POA: Diagnosis not present

## 2018-05-14 DIAGNOSIS — E119 Type 2 diabetes mellitus without complications: Secondary | ICD-10-CM | POA: Diagnosis not present

## 2018-05-14 DIAGNOSIS — H35033 Hypertensive retinopathy, bilateral: Secondary | ICD-10-CM | POA: Diagnosis not present

## 2018-05-21 DIAGNOSIS — E785 Hyperlipidemia, unspecified: Secondary | ICD-10-CM | POA: Diagnosis not present

## 2018-05-21 DIAGNOSIS — Z23 Encounter for immunization: Secondary | ICD-10-CM | POA: Diagnosis not present

## 2018-05-21 DIAGNOSIS — Z0000 Encounter for general adult medical examination without abnormal findings: Secondary | ICD-10-CM | POA: Diagnosis not present

## 2018-05-21 DIAGNOSIS — E119 Type 2 diabetes mellitus without complications: Secondary | ICD-10-CM | POA: Diagnosis not present

## 2018-05-21 DIAGNOSIS — R55 Syncope and collapse: Secondary | ICD-10-CM | POA: Diagnosis not present

## 2018-06-29 DIAGNOSIS — Z01419 Encounter for gynecological examination (general) (routine) without abnormal findings: Secondary | ICD-10-CM | POA: Diagnosis not present

## 2018-06-29 DIAGNOSIS — Z1151 Encounter for screening for human papillomavirus (HPV): Secondary | ICD-10-CM | POA: Diagnosis not present

## 2018-06-29 DIAGNOSIS — Z1231 Encounter for screening mammogram for malignant neoplasm of breast: Secondary | ICD-10-CM | POA: Diagnosis not present

## 2018-07-01 ENCOUNTER — Encounter (INDEPENDENT_AMBULATORY_CARE_PROVIDER_SITE_OTHER): Payer: Self-pay

## 2018-11-19 DIAGNOSIS — E119 Type 2 diabetes mellitus without complications: Secondary | ICD-10-CM | POA: Diagnosis not present

## 2018-11-19 DIAGNOSIS — I10 Essential (primary) hypertension: Secondary | ICD-10-CM | POA: Diagnosis not present

## 2018-11-19 DIAGNOSIS — E785 Hyperlipidemia, unspecified: Secondary | ICD-10-CM | POA: Diagnosis not present

## 2019-05-06 DIAGNOSIS — R112 Nausea with vomiting, unspecified: Secondary | ICD-10-CM | POA: Diagnosis not present

## 2019-05-07 DIAGNOSIS — Z20828 Contact with and (suspected) exposure to other viral communicable diseases: Secondary | ICD-10-CM | POA: Diagnosis not present

## 2019-06-17 DIAGNOSIS — Z0000 Encounter for general adult medical examination without abnormal findings: Secondary | ICD-10-CM | POA: Diagnosis not present

## 2019-07-01 IMAGING — MR MR PELVIS WO/W CM
8 of 12 series · 19 of 48 positions shown · IV contrast (multihance)
Comparison: 05/15/2017

CLINICAL DATA: Fibroids. Follow-up status post uterine artery
embolization.

EXAM:
MRI PELVIS WITHOUT AND WITH CONTRAST
TECHNIQUE: Multiplanar multisequence MR imaging of the pelvis was performed
both before and after administration of intravenous contrast.
CONTRAST:  17mL MULTIHANCE GADOBENATE DIMEGLUMINE 529 MG/ML IV SOLN

[Series 3: T2 · coronal · 5.0mm · 0.47mm/px · 1 of 33 slices shown (1 of 3)]
[im 1/33]
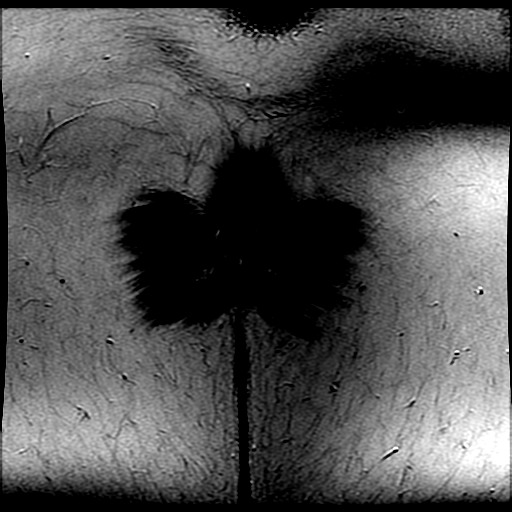

[Series 4: T2 · axial · 5.0mm · 0.47mm/px · z∈[-159,+45]mm · 3 of 35 slices shown (2 of 3)]
[im 1/35]
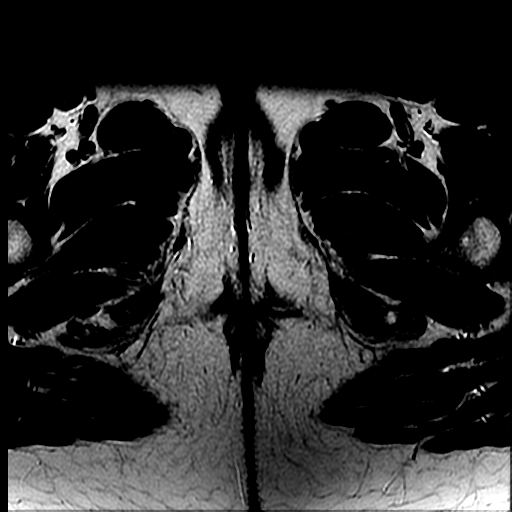
[im 18/35]
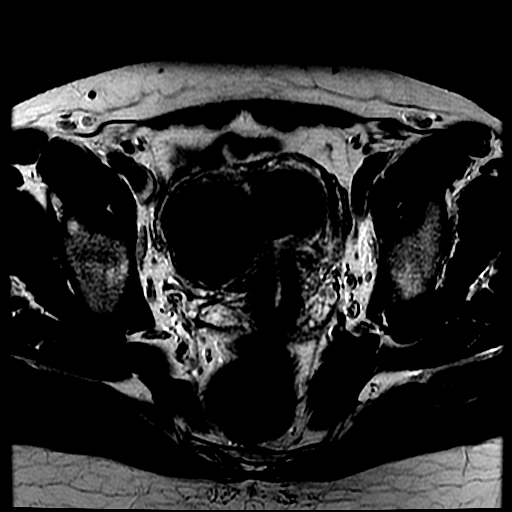
[im 35/35]
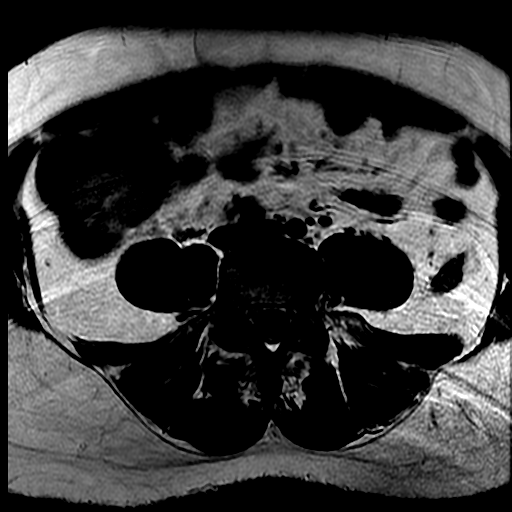

[Series 5: T2 fat-sat · axial · 5.0mm · 0.47mm/px · z∈[-159,+45]mm · 3 of 35 slices shown]
[im 1/35]
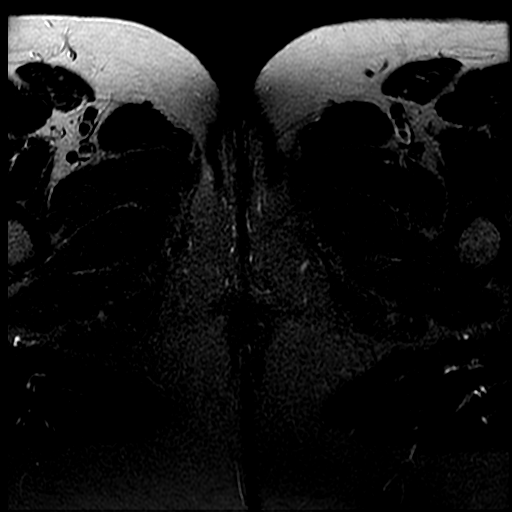
[im 18/35]
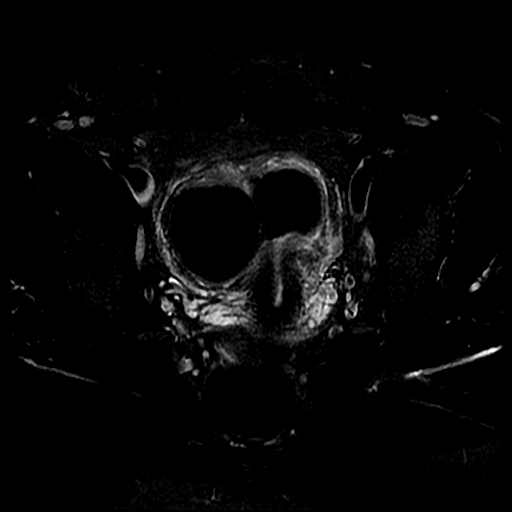
[im 35/35]
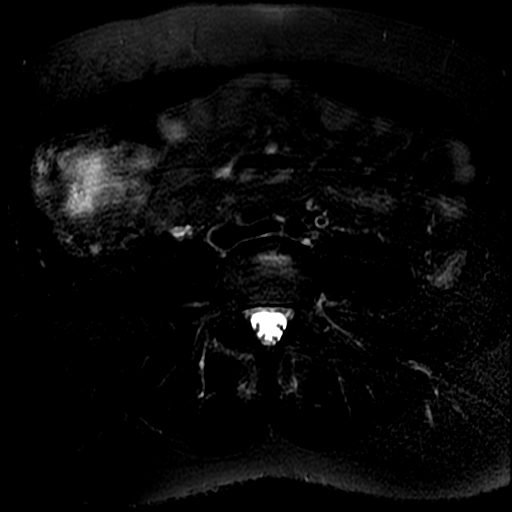

[Series 6: T2 · sagittal · 5.0mm · 0.47mm/px · 3 of 36 slices shown (3 of 3)]
[im 1/36]
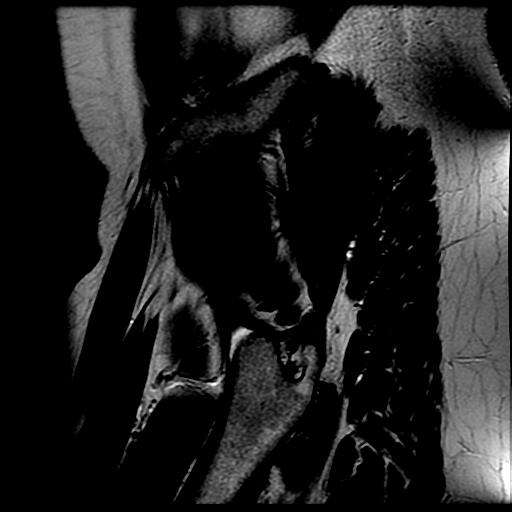
[im 18/36]
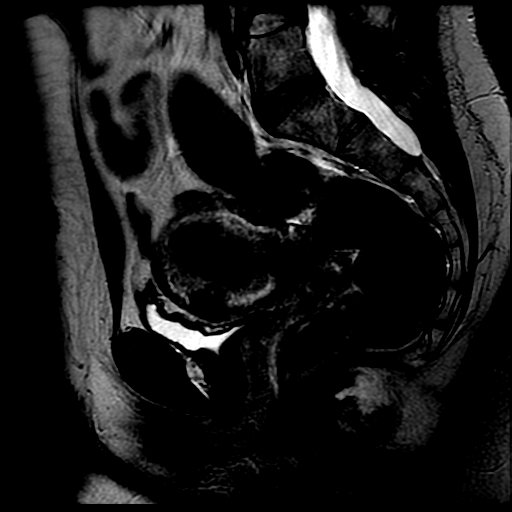
[im 36/36]
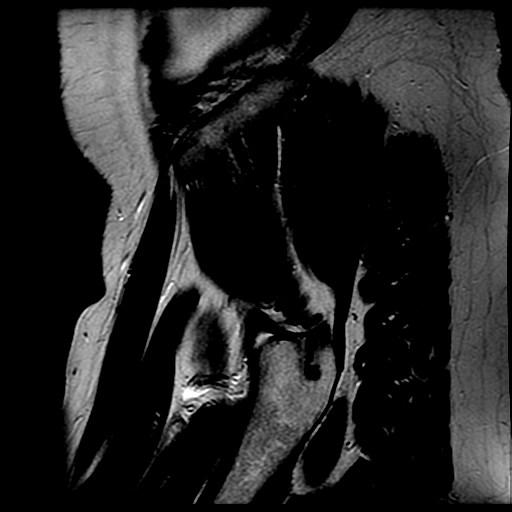

[Series 7: T1 · axial · 5.0mm · 0.47mm/px · z∈[-159,+45]mm · 3 of 35 slices shown]
[im 1/35]
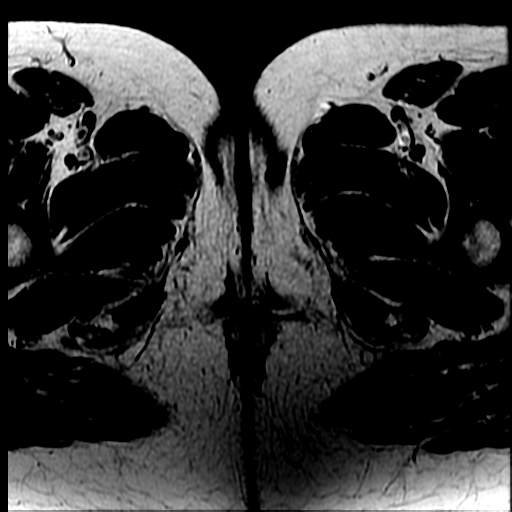
[im 18/35]
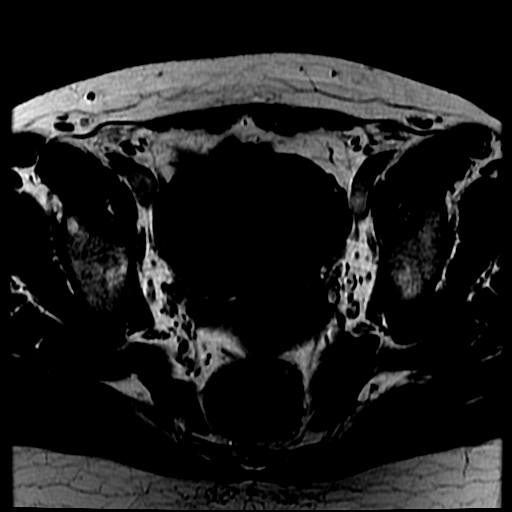
[im 35/35]
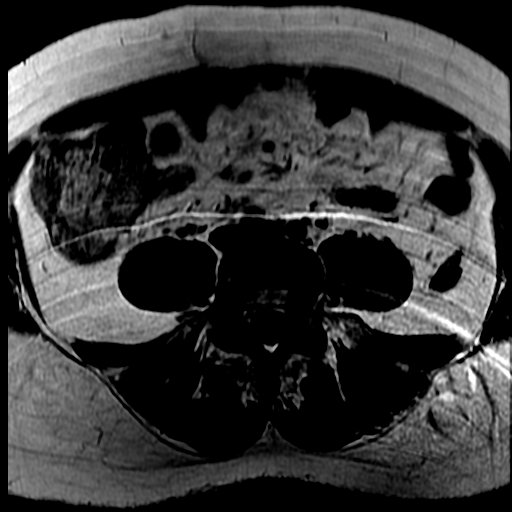

[Series 9: T1 fat-sat post-contrast · sagittal · 5.0mm · 0.47mm/px · 2 of 29 slices shown]
[im 1/29]
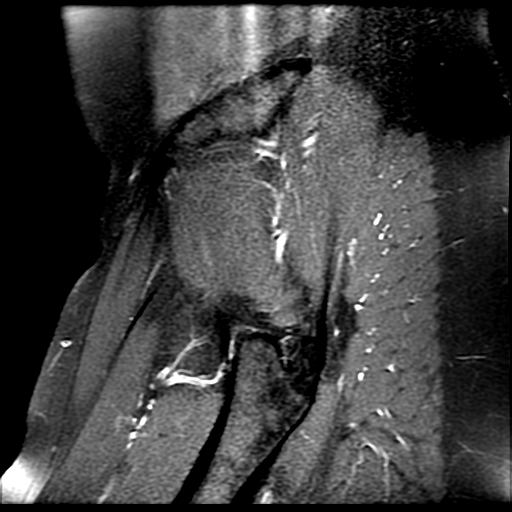
[im 29/29]
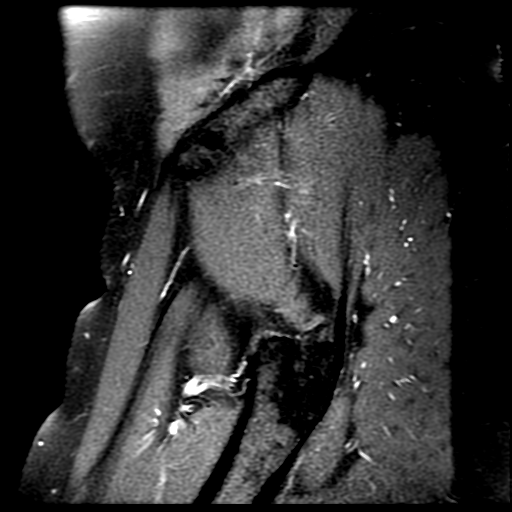

[Series 10: T2 post-contrast · sagittal · 5.0mm · 0.47mm/px · 3 of 36 slices shown]
[im 1/36]
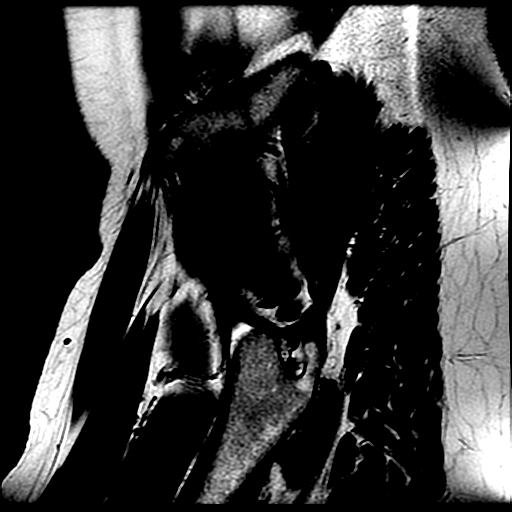
[im 18/36]
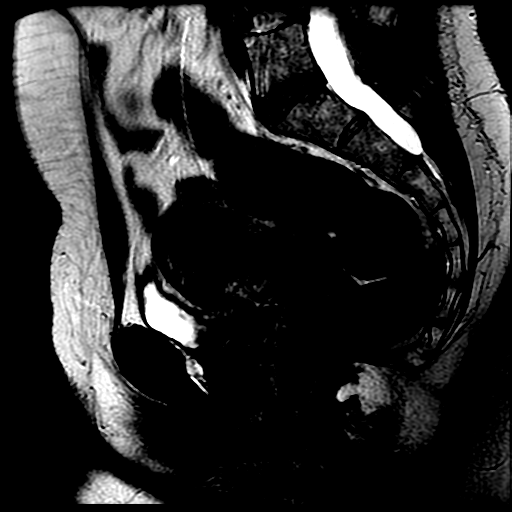
[im 36/36]
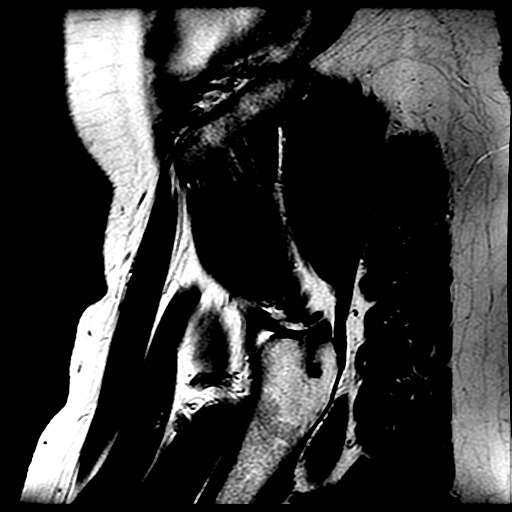

[Series 800: T1 dynamic · axial · 6.0mm · 0.47mm/px · 1 of 80 slices shown]
[im 1/80]
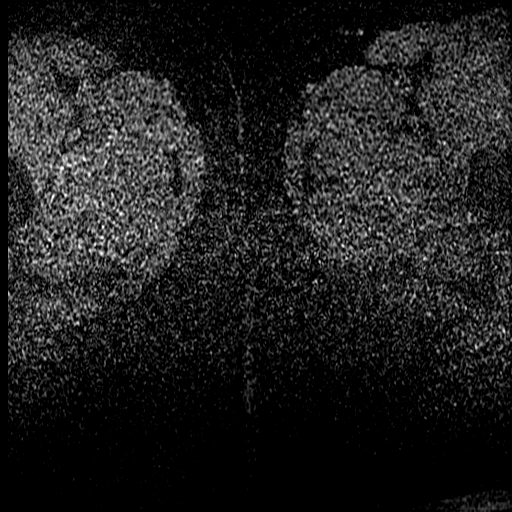

[19 of 48 positions shown; findings below may reference images not displayed]

FINDINGS: Urinary Tract: No urinary bladder or urethral abnormality.

Bowel: Unremarkable pelvic bowel loops.

Vascular/Lymphatic: Unremarkable. No pathologically enlarged pelvic
lymph nodes identified.

Reproductive:

-- Uterus: Measures 9.6 x 5.4 x 9.1 cm (volume = 250 cm^3).
Multiple fibroids are again seen, but no longer show evidence of
contrast enhancement. These fibroids show mild decrease in size,
with largest in the right lateral corpus measuring 4.8 cm image
[DATE], compared to 5.9 cm previously. No new or enlarging uterine
masses identified. Unremarkable appearance of cervix and vagina.

-- Right ovary: 3.3 cm simple right ovarian cyst with benign
characteristics.

-- Left ovary: 2.9 cm simple left ovarian cyst with benign
characteristics.

Other: Tiny amount of free fluid, likely physiologic.

Musculoskeletal:  Unremarkable.
IMPRESSION: Mild decrease in overall uterine volume and size of individual
fibroids since previous study. No new or enlarging uterine mass.

Benign-appearing bilateral ovarian cysts, most likely physiologic in
a reproductive age female.

## 2019-07-04 DIAGNOSIS — E119 Type 2 diabetes mellitus without complications: Secondary | ICD-10-CM | POA: Diagnosis not present

## 2019-07-04 DIAGNOSIS — Z23 Encounter for immunization: Secondary | ICD-10-CM | POA: Diagnosis not present

## 2019-07-04 DIAGNOSIS — E785 Hyperlipidemia, unspecified: Secondary | ICD-10-CM | POA: Diagnosis not present

## 2019-07-04 DIAGNOSIS — Z6830 Body mass index (BMI) 30.0-30.9, adult: Secondary | ICD-10-CM | POA: Diagnosis not present

## 2019-07-04 DIAGNOSIS — D649 Anemia, unspecified: Secondary | ICD-10-CM | POA: Diagnosis not present

## 2019-08-09 DIAGNOSIS — Z01419 Encounter for gynecological examination (general) (routine) without abnormal findings: Secondary | ICD-10-CM | POA: Diagnosis not present

## 2019-08-09 DIAGNOSIS — Z1231 Encounter for screening mammogram for malignant neoplasm of breast: Secondary | ICD-10-CM | POA: Diagnosis not present

## 2019-08-09 DIAGNOSIS — Z1151 Encounter for screening for human papillomavirus (HPV): Secondary | ICD-10-CM | POA: Diagnosis not present

## 2019-08-09 DIAGNOSIS — Z6831 Body mass index (BMI) 31.0-31.9, adult: Secondary | ICD-10-CM | POA: Diagnosis not present

## 2019-08-09 DIAGNOSIS — Z124 Encounter for screening for malignant neoplasm of cervix: Secondary | ICD-10-CM | POA: Diagnosis not present

## 2019-08-10 DIAGNOSIS — E611 Iron deficiency: Secondary | ICD-10-CM | POA: Diagnosis not present

## 2019-08-10 DIAGNOSIS — E119 Type 2 diabetes mellitus without complications: Secondary | ICD-10-CM | POA: Diagnosis not present

## 2019-08-10 DIAGNOSIS — E559 Vitamin D deficiency, unspecified: Secondary | ICD-10-CM | POA: Diagnosis not present

## 2019-08-10 DIAGNOSIS — R635 Abnormal weight gain: Secondary | ICD-10-CM | POA: Diagnosis not present

## 2019-08-10 DIAGNOSIS — N951 Menopausal and female climacteric states: Secondary | ICD-10-CM | POA: Diagnosis not present

## 2019-08-19 DIAGNOSIS — E559 Vitamin D deficiency, unspecified: Secondary | ICD-10-CM | POA: Diagnosis not present

## 2019-08-19 DIAGNOSIS — Z1339 Encounter for screening examination for other mental health and behavioral disorders: Secondary | ICD-10-CM | POA: Diagnosis not present

## 2019-08-19 DIAGNOSIS — Z6831 Body mass index (BMI) 31.0-31.9, adult: Secondary | ICD-10-CM | POA: Diagnosis not present

## 2019-08-19 DIAGNOSIS — E119 Type 2 diabetes mellitus without complications: Secondary | ICD-10-CM | POA: Diagnosis not present

## 2019-08-19 DIAGNOSIS — Z1331 Encounter for screening for depression: Secondary | ICD-10-CM | POA: Diagnosis not present

## 2019-08-19 DIAGNOSIS — I10 Essential (primary) hypertension: Secondary | ICD-10-CM | POA: Diagnosis not present

## 2019-08-25 DIAGNOSIS — E119 Type 2 diabetes mellitus without complications: Secondary | ICD-10-CM | POA: Diagnosis not present

## 2019-08-25 DIAGNOSIS — Z6831 Body mass index (BMI) 31.0-31.9, adult: Secondary | ICD-10-CM | POA: Diagnosis not present

## 2019-08-31 DIAGNOSIS — Z6831 Body mass index (BMI) 31.0-31.9, adult: Secondary | ICD-10-CM | POA: Diagnosis not present

## 2019-08-31 DIAGNOSIS — E559 Vitamin D deficiency, unspecified: Secondary | ICD-10-CM | POA: Diagnosis not present

## 2019-09-07 DIAGNOSIS — K5901 Slow transit constipation: Secondary | ICD-10-CM | POA: Diagnosis not present

## 2019-09-07 DIAGNOSIS — E559 Vitamin D deficiency, unspecified: Secondary | ICD-10-CM | POA: Diagnosis not present

## 2019-09-07 DIAGNOSIS — Z6830 Body mass index (BMI) 30.0-30.9, adult: Secondary | ICD-10-CM | POA: Diagnosis not present

## 2019-09-14 DIAGNOSIS — Z6829 Body mass index (BMI) 29.0-29.9, adult: Secondary | ICD-10-CM | POA: Diagnosis not present

## 2019-09-14 DIAGNOSIS — E611 Iron deficiency: Secondary | ICD-10-CM | POA: Diagnosis not present

## 2019-09-14 DIAGNOSIS — I10 Essential (primary) hypertension: Secondary | ICD-10-CM | POA: Diagnosis not present

## 2019-09-21 DIAGNOSIS — Z6830 Body mass index (BMI) 30.0-30.9, adult: Secondary | ICD-10-CM | POA: Diagnosis not present

## 2019-09-21 DIAGNOSIS — E119 Type 2 diabetes mellitus without complications: Secondary | ICD-10-CM | POA: Diagnosis not present

## 2019-09-21 DIAGNOSIS — R5383 Other fatigue: Secondary | ICD-10-CM | POA: Diagnosis not present

## 2019-09-30 ENCOUNTER — Institutional Professional Consult (permissible substitution): Payer: BLUE CROSS/BLUE SHIELD | Admitting: Plastic Surgery

## 2019-09-30 DIAGNOSIS — E611 Iron deficiency: Secondary | ICD-10-CM | POA: Diagnosis not present

## 2019-09-30 DIAGNOSIS — Z6829 Body mass index (BMI) 29.0-29.9, adult: Secondary | ICD-10-CM | POA: Diagnosis not present

## 2019-09-30 DIAGNOSIS — R5383 Other fatigue: Secondary | ICD-10-CM | POA: Diagnosis not present

## 2019-09-30 DIAGNOSIS — N951 Menopausal and female climacteric states: Secondary | ICD-10-CM | POA: Diagnosis not present

## 2019-10-04 DIAGNOSIS — D649 Anemia, unspecified: Secondary | ICD-10-CM | POA: Diagnosis not present

## 2019-10-04 DIAGNOSIS — N951 Menopausal and female climacteric states: Secondary | ICD-10-CM | POA: Diagnosis not present

## 2019-10-04 DIAGNOSIS — R232 Flushing: Secondary | ICD-10-CM | POA: Diagnosis not present

## 2019-10-04 DIAGNOSIS — E119 Type 2 diabetes mellitus without complications: Secondary | ICD-10-CM | POA: Diagnosis not present

## 2019-10-04 DIAGNOSIS — E7800 Pure hypercholesterolemia, unspecified: Secondary | ICD-10-CM | POA: Diagnosis not present

## 2019-10-04 DIAGNOSIS — H35033 Hypertensive retinopathy, bilateral: Secondary | ICD-10-CM | POA: Diagnosis not present

## 2019-10-04 DIAGNOSIS — Z6829 Body mass index (BMI) 29.0-29.9, adult: Secondary | ICD-10-CM | POA: Diagnosis not present

## 2019-10-04 DIAGNOSIS — H2513 Age-related nuclear cataract, bilateral: Secondary | ICD-10-CM | POA: Diagnosis not present

## 2019-10-07 DIAGNOSIS — Z6829 Body mass index (BMI) 29.0-29.9, adult: Secondary | ICD-10-CM | POA: Diagnosis not present

## 2019-10-07 DIAGNOSIS — E119 Type 2 diabetes mellitus without complications: Secondary | ICD-10-CM | POA: Diagnosis not present

## 2019-10-10 DIAGNOSIS — N951 Menopausal and female climacteric states: Secondary | ICD-10-CM | POA: Diagnosis not present

## 2019-10-10 DIAGNOSIS — R5383 Other fatigue: Secondary | ICD-10-CM | POA: Diagnosis not present

## 2019-10-10 DIAGNOSIS — R232 Flushing: Secondary | ICD-10-CM | POA: Diagnosis not present

## 2019-10-10 DIAGNOSIS — R6882 Decreased libido: Secondary | ICD-10-CM | POA: Diagnosis not present

## 2019-10-18 DIAGNOSIS — K5900 Constipation, unspecified: Secondary | ICD-10-CM | POA: Diagnosis not present

## 2019-10-18 DIAGNOSIS — D509 Iron deficiency anemia, unspecified: Secondary | ICD-10-CM | POA: Diagnosis not present

## 2019-10-18 DIAGNOSIS — R194 Change in bowel habit: Secondary | ICD-10-CM | POA: Diagnosis not present

## 2019-10-18 DIAGNOSIS — Z1211 Encounter for screening for malignant neoplasm of colon: Secondary | ICD-10-CM | POA: Diagnosis not present

## 2019-10-20 DIAGNOSIS — D509 Iron deficiency anemia, unspecified: Secondary | ICD-10-CM | POA: Diagnosis not present

## 2019-10-20 DIAGNOSIS — K5900 Constipation, unspecified: Secondary | ICD-10-CM | POA: Diagnosis not present

## 2019-10-20 DIAGNOSIS — R194 Change in bowel habit: Secondary | ICD-10-CM | POA: Diagnosis not present

## 2019-10-21 DIAGNOSIS — E559 Vitamin D deficiency, unspecified: Secondary | ICD-10-CM | POA: Diagnosis not present

## 2019-10-21 DIAGNOSIS — Z6829 Body mass index (BMI) 29.0-29.9, adult: Secondary | ICD-10-CM | POA: Diagnosis not present

## 2019-10-21 DIAGNOSIS — E611 Iron deficiency: Secondary | ICD-10-CM | POA: Diagnosis not present

## 2019-10-24 ENCOUNTER — Telehealth: Payer: Self-pay

## 2019-10-24 NOTE — Telephone Encounter (Signed)

## 2019-10-25 ENCOUNTER — Encounter: Payer: Self-pay | Admitting: Plastic Surgery

## 2019-10-25 ENCOUNTER — Ambulatory Visit: Payer: BC Managed Care – PPO | Admitting: Plastic Surgery

## 2019-10-25 ENCOUNTER — Other Ambulatory Visit: Payer: Self-pay

## 2019-10-25 DIAGNOSIS — M546 Pain in thoracic spine: Secondary | ICD-10-CM

## 2019-10-25 DIAGNOSIS — G8929 Other chronic pain: Secondary | ICD-10-CM

## 2019-10-25 DIAGNOSIS — M549 Dorsalgia, unspecified: Secondary | ICD-10-CM | POA: Insufficient documentation

## 2019-10-25 DIAGNOSIS — N62 Hypertrophy of breast: Secondary | ICD-10-CM | POA: Insufficient documentation

## 2019-10-25 DIAGNOSIS — M542 Cervicalgia: Secondary | ICD-10-CM | POA: Diagnosis not present

## 2019-10-25 NOTE — Progress Notes (Signed)
Patient ID: Eileen Larsen, female    DOB: 1971/05/23, 49 y.o.   MRN: [id number]   Chief Complaint  Patient presents with  . Breast Problem    Mammary Hyperplasia: The patient is a 49 y.o. female with a history of mammary hyperplasia for several years.  She has extremely large breasts causing symptoms that include the following: Back pain in the upper and lower back, including neck pain. She pulls or pins her bra straps to provide better lift and relief of the pressure and pain. She notices relief by holding her breast up manually.  Her shoulder straps cause grooves and pain and pressure that requires padding for relief. Pain medication is sometimes required with motrin and tylenol.  Activities that are hindered by enlarged breasts include: exercise and running.  Even washing her hair causes neck and back pain  Her breasts are extremely large and fairly symmetric.  She has hyperpigmentation of the inframammary area on both sides.  The sternal to nipple distance on the right is 34 cm and the left is 34 cm.  The IMF distance is 16 cm.  She is 5 feet 6 inches tall and weighs 182 pounds.  Preoperative bra size = 38 F cup.  She would like to be a C or D cup.  The estimated excess breast tissue to be removed at the time of surgery = 575 grams on the left and 575 grams on the right.  Mammogram history: Last mammogram was November 2020 and was negative.  Her last hemoglobin A1c was 7.1 in January 2021.  She has 2 children and breast-fed both of them.  She also had a fibroid embolization and not had any trouble since then.   Review of Systems  Constitutional: Positive for activity change. Negative for appetite change.  HENT: Negative.   Eyes: Negative.   Respiratory: Negative for chest tightness and shortness of breath.   Cardiovascular: Negative for leg swelling.  Gastrointestinal: Negative for abdominal distention and abdominal pain.  Endocrine: Negative.   Genitourinary: Negative.    Musculoskeletal: Positive for back pain and neck pain.  Skin: Positive for color change. Negative for wound.  Hematological: Negative.   Psychiatric/Behavioral: Negative.     Past Medical History:  Diagnosis Date  . Diabetes mellitus without complication ([place])   . Fibroids   . Hyperlipidemia   . Hypertension   . Kidney stone     Past Surgical History:  Procedure Laterality Date  . IR ANGIOGRAM PELVIS SELECTIVE OR SUPRASELECTIVE  07/09/2017  . IR ANGIOGRAM PELVIS SELECTIVE OR SUPRASELECTIVE  07/09/2017  . IR ANGIOGRAM SELECTIVE EACH ADDITIONAL VESSEL  07/09/2017  . IR ANGIOGRAM SELECTIVE EACH ADDITIONAL VESSEL  07/09/2017  . IR EMBO TUMOR ORGAN ISCHEMIA INFARCT INC GUIDE ROADMAPPING  07/09/2017  . IR RADIOLOGIST EVAL & MGMT  05/06/2017  . IR RADIOLOGIST EVAL & MGMT  08/05/2017  . IR RADIOLOGIST EVAL & MGMT  12/31/2017  . IR US GUIDE VASC ACCESS RIGHT  07/09/2017  . LITHOTRIPSY        Current Outpatient Medications:  .  amLODipine (NORVASC) 5 MG tablet, Take 5 mg by mouth daily., Disp: , Rfl:  .  atorvastatin (LIPITOR) 20 MG tablet, Take 20 mg by mouth daily., Disp: , Rfl:  .  hydrochlorothiazide (HYDRODIURIL) 25 MG tablet, Take 25 mg by mouth daily., Disp: , Rfl:  .  ibuprofen (ADVIL,MOTRIN) 800 MG tablet, Take 1 tablet (800 mg total) by mouth 3 (three) times daily., Disp: 21  tablet, Rfl: 0 .  JARDIANCE 10 MG TABS tablet, Take 10 mg by mouth daily., Disp: , Rfl:  .  liothyronine (CYTOMEL) 5 MCG tablet, TAKE 1 TABLET FOR 7 DAYS, THEN INCREASE TO 2 TABLETS A DAY, Disp: , Rfl:  .  metFORMIN (GLUCOPHAGE) 500 MG tablet, Take 1,000 mg by mouth 2 (two) times daily with a meal. , Disp: , Rfl:    Objective:   Vitals:   10/25/19 0814  BP: 132/87  Pulse: 86  Temp: (!) 97.1 F (36.2 C)  SpO2: 99%    Physical Exam Vitals and nursing note reviewed.  Constitutional:      Appearance: Normal appearance.  HENT:     Head: Normocephalic and atraumatic.  Eyes:     Extraocular  Movements: Extraocular movements intact.  Cardiovascular:     Rate and Rhythm: Normal rate.     Pulses: Normal pulses.  Pulmonary:     Effort: Pulmonary effort is normal.  Abdominal:     General: Abdomen is flat. There is no distension.     Tenderness: There is no abdominal tenderness.  Neurological:     General: No focal deficit present.     Mental Status: She is alert and oriented to person, place, and time.  Psychiatric:        Mood and Affect: Mood normal.        Behavior: Behavior normal.        Thought Content: Thought content normal.        Judgment: Judgment normal.     Assessment & Plan:  Chronic bilateral thoracic back pain  Neck pain  Symptomatic mammary hypertrophy  Recommend bilateral breast reduction.   Recommend physical therapy.  Will need results from her mammogram Will need an updated hemoglobin A1c prior to surgery.  We discussed some of the risks and complications which include loss of nipple areola sensation or hypersensitivity.  Scarring is also a side effect of the surgery.  Not being able to breast-feed after surgery is also a risk. Pictures were obtained of the patient and placed in the chart with the patient's or guardian's permission.   [place], DO   All the the 21st Century Cures Act was signed into law in 2016 which includes the topic of electronic health records.  This provides immediate access to information in MyChart.  This includes consultation notes, operative notes, office notes, lab results and pathology reports.  If you have any questions about what you read please let us know at your next visit or call us at the office.  We are right here with you.

## 2019-10-26 DIAGNOSIS — Z6829 Body mass index (BMI) 29.0-29.9, adult: Secondary | ICD-10-CM | POA: Diagnosis not present

## 2019-10-26 DIAGNOSIS — E039 Hypothyroidism, unspecified: Secondary | ICD-10-CM | POA: Diagnosis not present

## 2019-10-31 ENCOUNTER — Other Ambulatory Visit: Payer: Self-pay

## 2019-10-31 ENCOUNTER — Ambulatory Visit: Payer: BC Managed Care – PPO | Attending: Plastic Surgery | Admitting: Physical Therapy

## 2019-10-31 DIAGNOSIS — M542 Cervicalgia: Secondary | ICD-10-CM

## 2019-10-31 DIAGNOSIS — G8929 Other chronic pain: Secondary | ICD-10-CM | POA: Diagnosis not present

## 2019-10-31 DIAGNOSIS — M6281 Muscle weakness (generalized): Secondary | ICD-10-CM

## 2019-10-31 DIAGNOSIS — M25512 Pain in left shoulder: Secondary | ICD-10-CM | POA: Insufficient documentation

## 2019-10-31 DIAGNOSIS — M546 Pain in thoracic spine: Secondary | ICD-10-CM | POA: Diagnosis not present

## 2019-10-31 DIAGNOSIS — M25511 Pain in right shoulder: Secondary | ICD-10-CM | POA: Insufficient documentation

## 2019-10-31 DIAGNOSIS — R293 Abnormal posture: Secondary | ICD-10-CM | POA: Diagnosis not present

## 2019-10-31 NOTE — Therapy (Addendum)
[facility] [address]  [place], [place], [address] Phone: [phone]   Fax:  [phone]  Physical Therapy Evaluation  Patient Details  Name: Eileen Larsen MRN: [id number] Date of Birth: August 30, 1971 Referring Provider (PT): Audelia Hives, DO   Encounter Date: 10/31/2019  PT End of Session - 10/31/19 0804    Visit Number  1    Number of Visits  12    Date for PT Re-Evaluation  12/12/19    Authorization Type  BCBS [place]: [place]    PT Start Time  0804    PT Stop Time  0848    PT Time Calculation (min)  44 min    Activity Tolerance  Patient tolerated treatment well    Behavior During Therapy  [facility] for tasks assessed/performed       Past Medical History:  Diagnosis Date  . Diabetes mellitus without complication ([place])   . Fibroids   . Hyperlipidemia   . Hypertension   . Kidney stone     Past Surgical History:  Procedure Laterality Date  . IR ANGIOGRAM PELVIS SELECTIVE OR SUPRASELECTIVE  07/09/2017  . IR ANGIOGRAM PELVIS SELECTIVE OR SUPRASELECTIVE  07/09/2017  . IR ANGIOGRAM SELECTIVE EACH ADDITIONAL VESSEL  07/09/2017  . IR ANGIOGRAM SELECTIVE EACH ADDITIONAL VESSEL  07/09/2017  . IR EMBO TUMOR ORGAN ISCHEMIA INFARCT INC GUIDE ROADMAPPING  07/09/2017  . IR RADIOLOGIST EVAL & MGMT  05/06/2017  . IR RADIOLOGIST EVAL & MGMT  08/05/2017  . IR RADIOLOGIST EVAL & MGMT  12/31/2017  . IR US GUIDE VASC ACCESS RIGHT  07/09/2017  . LITHOTRIPSY      There were no vitals filed for this visit.   Subjective Assessment - 10/31/19 0807    Subjective  Pt reporting shoulder and upper back pain esp when having to wash her hair. Also notes constant pain & tightness in upper shoulders at ~4-5/10 which worsens with stress.    Limitations  House hold activities;Other (comment)    Patient Stated Goals  "to not have the pain and fatigue when I have to have my hands up for ~40 minutes while washing my hair"    Currently in  Pain?  Yes    Pain Score  0-No pain   6/10 while washing hair   Pain Location  Back    Pain Orientation  Upper    Pain Descriptors / Indicators  --   "strain"   Pain Type  Chronic pain    Pain Radiating Towards  lower back    Pain Onset  Other (comment)   "years"   Pain Frequency  Intermittent    Aggravating Factors   washing her hair    Pain Relieving Factors  has to stop while showering, rest    Effect of Pain on Daily Activities  makes washingher hair difficult    Multiple Pain Sites  Yes    Pain Score  5   4-5/10   Pain Location  Shoulder    Pain Orientation  Right;Left;Upper   L>R   Pain Descriptors / Indicators  Tightness;Pressure    Pain Type  Chronic pain    Pain Radiating Towards  n/a    Pain Onset  Other (comment)   since she was a teenager   Pain Frequency  Several days a week    Aggravating Factors   stress    Pain Relieving Factors  massage, moving arm(s) around    Effect of Pain  on Daily Activities  fatigue         [facility] PT Assessment - 10/31/19 0804      Assessment   Medical Diagnosis  Chronic upper back, neck & B shoulder 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   No    Is the patient reluctant to leave their home because of a fear of falling?   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    Leisure  gamer; walking in neighborhood - typically 5-7 days per week; body pump classes prior to pandemic       Cognition   Overall Cognitive Status  Within Functional Limits for tasks assessed      Posture/Postural Control   Posture/Postural Control  Postural limitations    Postural Limitations  Rounded Shoulders;Forward head;Increased thoracic kyphosis      ROM / Strength   AROM / PROM / Strength  AROM;Strength      AROM   Overall AROM Comments  B shoulder flexion & abduction symmetrical & WFL    AROM Assessment Site  Cervical;Shoulder    Right/Left Shoulder  Right;Left    Right Shoulder Internal Rotation  --   FIR to T9   Right Shoulder External Rotation  --   FER to T4   Left Shoulder Internal Rotation  --   FIR to T12   Left Shoulder External Rotation  --   FER to T1   Cervical Flexion  54    Cervical Extension  38    Cervical - Right Side Bend  36    Cervical - Left Side Bend  30    Cervical - Right Rotation  57    Cervical - Left Rotation  52      Strength   Overall Strength Comments  "strain" reported with L shoulder resistance    Strength Assessment Site  Shoulder    Right/Left Shoulder  Right;Left    Right Shoulder Flexion  4+/5    Right Shoulder ABduction  4+/5    Right Shoulder Internal Rotation  4+/5    Right Shoulder External Rotation  4+/5    Left Shoulder Flexion  4/5    Left Shoulder ABduction  4/5    Left Shoulder Internal Rotation  4+/5    Left Shoulder External Rotation  4/5      Palpation   Palpation comment  increased muscle tension in B UT & LS (L>R)                Objective measurements completed on examination: See above findings.      [facility] Adult PT Treatment/Exercise - 10/31/19 0804      Neck Exercises: Seated   Neck Retraction  10 reps;5 secs    Other Seated Exercise  B scap retraction & depression 10 x 5"      Neck Exercises: Stretches   Upper Trapezius Stretch  Left;30 seconds;1 rep    Levator Stretch  Left;30 seconds;1 rep    Corner Stretch  30 seconds;3 reps    Corner Stretch Limitations  3-way doorway pec stretch     Other Neck Stretches  Seated B rhomboid stretch x 30 sec             PT Education - 10/31/19 0845    Education Details  PT eval findings, anticipated POC & intial HEP    Person(s) Educated  Patient    Methods  Explanation;Demonstration;Verbal cues;Handout    Comprehension  Verbalized understanding;Returned demonstration;Verbal cues required;Need further instruction       PT Short Term Goals - 10/31/19 0848      PT SHORT TERM GOAL #1   Title  Patient will be independent with initial HEP    Status  New    Target Date  11/21/19      PT SHORT TERM GOAL #2   Title  Patient will verbalize/demonstrate good awareness of neutral spine adn shoulder posture and proper body mechanics for daily tasks    Status  New    Target Date  11/21/19        PT Long Term Goals - 10/31/19 0848      PT LONG TERM GOAL #1   Title  Patient will be independent with ongoing/advanced HEP    Status  New    Target Date  12/12/19      PT LONG TERM GOAL #2   Title  Patient to improve neck & B shoudler AROM to WNL/WFL without pain provocation    Status  New    Target Date  12/12/19      PT LONG TERM GOAL #3   Title  Patient will demonstrate improved L shoulder strength to >/= 4+/5 w/o pain provocation for improved activity tolerance    Status  New    Target Date  12/12/19      PT LONG TERM GOAL #4   Title  Patient to report ability to complete hair care w/o limitation due to upper back or shoulder pain    Status  New    Target Date  12/12/19             Plan - 10/31/19 0848    Clinical Impression Statement  Eileen Larsen is a 49 y/o female who presents to OP PT for chronic neck, upper back and B shoulder pain which she attributes to mammary hypertrophy. Her biggest complaint is that the pain limits her in the shower when washing and conditioning her hair which can take up to 40 minutes. Deficits include forward head and rounded shoulder posture, decreased cervical and B shoulder ROM and L>R  shoulder weakness with pain/"strain" reported during resisted L shoulder MMT. Eileen Larsen will benefit from skilled PT to address above deficits to increase tolerance for functional use of B UE with daily tasks including hair care without limitation due to pain, LOM or weakness.    Personal Factors and Comorbidities  Comorbidity 3+;Time since onset of injury/illness/exacerbation;Past/Current Experience    Comorbidities  neck, upper back and shoulder pain d/t mammary hypertrophy, DM, HTN    Examination-Activity Limitations  Bathing;Hygiene/Grooming;Reach Overhead    Examination-Participation Restrictions  Meal Prep    Stability/Clinical Decision Making  Stable/Uncomplicated    Clinical Decision Making  Low    Rehab Potential  Good    PT Frequency  2x / week    PT Duration  4 weeks    PT Treatment/Interventions  ADLs/Self Care Home Management;Cryotherapy;Electrical Stimulation;Iontophoresis 4mg /ml Dexamethasone;Moist Heat;Traction;Ultrasound;Functional mobility training;Therapeutic activities;Therapeutic exercise;Neuromuscular re-education;Patient/family education;Manual techniques;Passive range of motion;Dry needling;Taping;Spinal Manipulations;Joint Manipulations;Vasopneumatic Device    PT Next Visit Plan  Review initial HEP; postural stretchinng & strengthening; posture & body mechaincs educaiton; manual therapy & modalities PRN    PT Home Exercise Plan  10/31/19 - UT, LS, rhomboid & pec stretches, chin tuck, scap retraction    Consulted and Agree with Plan of Care  Patient       Patient will benefit from skilled therapeutic intervention in order to improve the following deficits and impairments:  Decreased activity tolerance, Decreased range of motion, Decreased strength, Increased fascial restricitons, Increased muscle spasms, Impaired perceived functional ability, Impaired flexibility, Impaired UE functional use, Improper body mechanics, Postural dysfunction, Pain  Visit Diagnosis: Pain in thoracic  spine - Plan: PT plan of care cert/re-cert  Chronic left shoulder pain - Plan: PT plan of care cert/re-cert  Chronic right shoulder pain - Plan: PT plan of care cert/re-cert  Cervicalgia - Plan: PT plan of care cert/re-cert  Abnormal posture - Plan: PT plan of care cert/re-cert  Muscle weakness (generalized) - Plan: PT plan of care cert/re-cert     Problem List Patient Active Problem List   Diagnosis Date Noted  . Back pain 10/25/2019  . Neck pain 10/25/2019  . 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 Fibroid 07/10/2017  . Menorrhagia, premenopausal 07/09/2017    Percival Spanish, PT, MPT 10/31/2019, 7:38 PM  [facility] [address]  [place], [place], [address] Phone: [phone]   Fax:  [phone]  Name: Eileen Larsen MRN: [id number] Date of Birth: [birth year]

## 2019-10-31 NOTE — Patient Instructions (Signed)
    Home exercise program created by Zaniya Mcaulay, PT.  For questions, please contact Zyshonne Malecha via phone at [phone] or email at [email or web address]  [facility] [place] [address]  [address] [place], [place], [address] Phone: [phone]   Fax:  [phone]    

## 2019-10-31 NOTE — Addendum Note (Signed)
Addended by: Percival Spanish on: 10/31/2019 07:42 PM   Modules accepted: Orders

## 2019-11-04 ENCOUNTER — Ambulatory Visit: Payer: BC Managed Care – PPO | Admitting: Physical Therapy

## 2019-11-04 ENCOUNTER — Other Ambulatory Visit: Payer: Self-pay

## 2019-11-04 ENCOUNTER — Ambulatory Visit: Payer: BC Managed Care – PPO

## 2019-11-04 DIAGNOSIS — R293 Abnormal posture: Secondary | ICD-10-CM | POA: Diagnosis not present

## 2019-11-04 DIAGNOSIS — M546 Pain in thoracic spine: Secondary | ICD-10-CM

## 2019-11-04 DIAGNOSIS — E119 Type 2 diabetes mellitus without complications: Secondary | ICD-10-CM | POA: Diagnosis not present

## 2019-11-04 DIAGNOSIS — M25511 Pain in right shoulder: Secondary | ICD-10-CM

## 2019-11-04 DIAGNOSIS — G8929 Other chronic pain: Secondary | ICD-10-CM | POA: Diagnosis not present

## 2019-11-04 DIAGNOSIS — M25512 Pain in left shoulder: Secondary | ICD-10-CM | POA: Diagnosis not present

## 2019-11-04 DIAGNOSIS — M542 Cervicalgia: Secondary | ICD-10-CM

## 2019-11-04 DIAGNOSIS — M6281 Muscle weakness (generalized): Secondary | ICD-10-CM

## 2019-11-04 DIAGNOSIS — Z6830 Body mass index (BMI) 30.0-30.9, adult: Secondary | ICD-10-CM | POA: Diagnosis not present

## 2019-11-04 NOTE — Therapy (Addendum)
[facility] [address]  [place], [place], [address] Phone: [phone]   Fax:  [phone]  Physical Therapy Treatment  Patient Details  Name: Eileen Larsen MRN: [id number] Date of Birth: 09-02-1971 Referring Provider (PT): Audelia Hives, DO   Encounter Date: 11/04/2019  PT End of Session - 11/04/19 1233    Visit Number  2    Number of Visits  12    Date for PT Re-Evaluation  12/12/19    Authorization Type  BCBS - [place]: [place]    PT Start Time  1234    PT Stop Time  1340    PT Time Calculation (min)  66 min    Activity Tolerance  Patient tolerated treatment well    Behavior During Therapy  [facility] for tasks assessed/performed       Past Medical History:  Diagnosis Date  . Diabetes mellitus without complication ([place])   . Fibroids   . Hyperlipidemia   . Hypertension   . Kidney stone     Past Surgical History:  Procedure Laterality Date  . IR ANGIOGRAM PELVIS SELECTIVE OR SUPRASELECTIVE  07/09/2017  . IR ANGIOGRAM PELVIS SELECTIVE OR SUPRASELECTIVE  07/09/2017  . IR ANGIOGRAM SELECTIVE EACH ADDITIONAL VESSEL  07/09/2017  . IR ANGIOGRAM SELECTIVE EACH ADDITIONAL VESSEL  07/09/2017  . IR EMBO TUMOR ORGAN ISCHEMIA INFARCT INC GUIDE ROADMAPPING  07/09/2017  . IR RADIOLOGIST EVAL & MGMT  05/06/2017  . IR RADIOLOGIST EVAL & MGMT  08/05/2017  . IR RADIOLOGIST EVAL & MGMT  12/31/2017  . IR US GUIDE VASC ACCESS RIGHT  07/09/2017  . LITHOTRIPSY      There were no vitals filed for this visit.  Subjective Assessment - 11/04/19 1238    Subjective  Pt. doing well today.    Patient Stated Goals  "to not have the pain and fatigue when I have to have my hands up for ~40 minutes while washing my hair"    Currently in Pain?  No/denies    Pain Score  0-No pain   Up to 6/10 at times with washing hair   Pain Location  Back    Pain Orientation  Upper    Pain Descriptors / Indicators  --   "strain"   Pain Type  Chronic  pain    Aggravating Factors   washing her hair    Pain Relieving Factors  Has to stop while showing, rest    Multiple Pain Sites  Yes    Pain Score  4   pain rising to 8/10 at worst   Pain Location  Shoulder    Pain Orientation  Left;Upper    Pain Descriptors / Indicators  Tightness;Pressure    Pain Type  Chronic pain                       [facility] Adult PT Treatment/Exercise - 11/04/19 0001      Self-Care   Self-Care  Other Self-Care Comments    Other Self-Care Comments   Instruction in proper posture with desk positining       Neck Exercises: Theraband   Scapula Retraction  10 reps    Scapula Retraction Limitations  5" x 10 reps    Shoulder Extension  10 reps    Shoulder Extension Limitations  yellow TB in door       Neck Exercises: Seated   Neck Retraction  10 reps;5 secs    Other Seated  Exercise  B scap retraction & depression 10 x 5"      Modalities   Modalities  Moist Heat;Electrical Stimulation      Moist Heat Therapy   Number Minutes Moist Heat  10 Minutes    Moist Heat Location  --   B upper shoulders      Electrical Stimulation   Electrical Stimulation Location  L upper trap    Electrical Stimulation Action  IFC    Electrical Stimulation Parameters  80-150Hz , intensity to pt. tolerance, 15'    Electrical Stimulation Goals  Pain      Manual Therapy   Manual Therapy  Soft tissue mobilization;Myofascial release    Manual therapy comments  Seated     Soft tissue mobilization  STM/DTM to B UT, LS, rhomboids     Myofascial Release  TPR to L mid trap       Neck Exercises: Stretches   Upper Trapezius Stretch  Left;30 seconds;1 rep    Levator Stretch  Left;30 seconds;1 rep    Corner Stretch  30 seconds;3 reps    Corner Stretch Limitations  3-way doorway pec stretch    Other Neck Stretches  Seated B rhomboid stretch 2 x 30 sec             PT Education - 11/04/19 1341    Education Details  HEP update;  row and extension with yellow TB     Person(s) Educated  Patient    Methods  Explanation;Demonstration;Verbal cues;Handout    Comprehension  Verbalized understanding;Returned demonstration;Verbal cues required       PT Short Term Goals - 11/04/19 1234      PT SHORT TERM GOAL #1   Title  Patient will be independent with initial HEP    Status  On-going    Target Date  11/21/19      PT SHORT TERM GOAL #2   Title  Patient will verbalize/demonstrate good awareness of neutral spine adn shoulder posture and proper body mechanics for daily tasks    Status  On-going    Target Date  11/21/19        PT Long Term Goals - 11/04/19 1234      PT LONG TERM GOAL #1   Title  Patient will be independent with ongoing/advanced HEP    Status  On-going      PT LONG TERM GOAL #2   Title  Patient to improve neck & B shoudler AROM to WNL/WFL without pain provocation    Status  On-going      PT LONG TERM GOAL #3   Title  Patient will demonstrate improved L shoulder strength to >/= 4+/5 w/o pain provocation for improved activity tolerance    Status  On-going      PT LONG TERM GOAL #4   Title  Patient to report ability to complete hair care w/o limitation due to upper back or shoulder pain    Status  On-going            Plan - 11/04/19 1243    Clinical Impression Statement  Doing well.  Primary complaint was upper L shoulder tightness/pain.  MT addressed TP and increased tension in upper shoulder musculature.  postural strengthening for scapular/postural musculature.  Instructed pt. on proper desk positioning to reduce shoulder strain.  Ended session with trial of E-stim/moist heat to L UT.    Comorbidities  neck, upper back and shoulder pain d/t mammary hypertrophy, DM, HTN    Rehab Potential  Good    PT Treatment/Interventions  ADLs/Self Care Home Management;Cryotherapy;Electrical Stimulation;Iontophoresis 4mg /ml Dexamethasone;Moist Heat;Traction;Ultrasound;Functional mobility training;Therapeutic activities;Therapeutic  exercise;Neuromuscular re-education;Patient/family education;Manual techniques;Passive range of motion;Dry needling;Taping;Spinal Manipulations;Joint Manipulations;Vasopneumatic Device    PT Next Visit Plan  Postural stretchinng & strengthening; posture & body mechaincs educaiton; manual therapy & modalities PRN    PT Home Exercise Plan  10/31/19 - UT, LS, rhomboid & pec stretches, chin tuck, scap retraction    Consulted and Agree with Plan of Care  Patient       Patient will benefit from skilled therapeutic intervention in order to improve the following deficits and impairments:  Decreased activity tolerance, Decreased range of motion, Decreased strength, Increased fascial restricitons, Increased muscle spasms, Impaired perceived functional ability, Impaired flexibility, Impaired UE functional use, Improper body mechanics, Postural dysfunction, Pain  Visit Diagnosis: Pain in thoracic spine  Chronic left shoulder pain  Chronic right shoulder pain  Cervicalgia  Abnormal posture  Muscle weakness (generalized)     Problem List Patient Active Problem List   Diagnosis Date Noted  . Back pain 10/25/2019  . Neck pain 10/25/2019  . 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 Fibroid 07/10/2017  . Menorrhagia, premenopausal 07/09/2017    Bess Harvest, PTA 11/04/19 1:57 PM   [facility] [address]  [place], [place], [address] Phone: [phone]   Fax:  [phone]  Name: Eileen Larsen MRN: [id number] Date of Birth: September 06, 1971

## 2019-11-07 ENCOUNTER — Ambulatory Visit: Payer: BC Managed Care – PPO

## 2019-11-07 ENCOUNTER — Other Ambulatory Visit: Payer: Self-pay

## 2019-11-07 DIAGNOSIS — M546 Pain in thoracic spine: Secondary | ICD-10-CM | POA: Diagnosis not present

## 2019-11-07 DIAGNOSIS — G8929 Other chronic pain: Secondary | ICD-10-CM

## 2019-11-07 DIAGNOSIS — M6281 Muscle weakness (generalized): Secondary | ICD-10-CM | POA: Diagnosis not present

## 2019-11-07 DIAGNOSIS — R293 Abnormal posture: Secondary | ICD-10-CM | POA: Diagnosis not present

## 2019-11-07 DIAGNOSIS — M542 Cervicalgia: Secondary | ICD-10-CM

## 2019-11-07 DIAGNOSIS — M25511 Pain in right shoulder: Secondary | ICD-10-CM | POA: Diagnosis not present

## 2019-11-07 DIAGNOSIS — M25512 Pain in left shoulder: Secondary | ICD-10-CM | POA: Diagnosis not present

## 2019-11-07 NOTE — Patient Instructions (Signed)

## 2019-11-07 NOTE — Therapy (Signed)
[facility] [address]  [place], [place], [address] Phone: [phone]   Fax:  [phone]  Physical Therapy Treatment  Patient Details  Name: Eileen Larsen MRN: [id number] Date of Birth: Aug 28, 1971 Referring Provider (PT): Audelia Hives, DO   Encounter Date: 11/07/2019  PT End of Session - 11/07/19 0808    Visit Number  3    Number of Visits  12    Date for PT Re-Evaluation  12/12/19    Authorization Type  BCBS - [place]: [place]    PT Start Time  0804    PT Stop Time  0915    PT Time Calculation (min)  71 min    Activity Tolerance  Patient tolerated treatment well    Behavior During Therapy  [facility] for tasks assessed/performed       Past Medical History:  Diagnosis Date  . Diabetes mellitus without complication ([place])   . Fibroids   . Hyperlipidemia   . Hypertension   . Kidney stone     Past Surgical History:  Procedure Laterality Date  . IR ANGIOGRAM PELVIS SELECTIVE OR SUPRASELECTIVE  07/09/2017  . IR ANGIOGRAM PELVIS SELECTIVE OR SUPRASELECTIVE  07/09/2017  . IR ANGIOGRAM SELECTIVE EACH ADDITIONAL VESSEL  07/09/2017  . IR ANGIOGRAM SELECTIVE EACH ADDITIONAL VESSEL  07/09/2017  . IR EMBO TUMOR ORGAN ISCHEMIA INFARCT INC GUIDE ROADMAPPING  07/09/2017  . IR RADIOLOGIST EVAL & MGMT  05/06/2017  . IR RADIOLOGIST EVAL & MGMT  08/05/2017  . IR RADIOLOGIST EVAL & MGMT  12/31/2017  . IR US GUIDE VASC ACCESS RIGHT  07/09/2017  . LITHOTRIPSY      There were no vitals filed for this visit.  Subjective Assessment - 11/07/19 0807    Subjective  Pt. noting pain relief after last session.  Notes reduced pain over weekend.    Patient Stated Goals  "to not have the pain and fatigue when I have to have my hands up for ~40 minutes while washing my hair"    Currently in Pain?  No/denies    Pain Score  0-No pain   up to 2/10 at times   Pain Location  Back    Pain Orientation  Upper    Pain Descriptors / Indicators  --    "strain"   Pain Type  Chronic pain    Aggravating Factors   washing hair    Multiple Pain Sites  No                       [facility] Adult PT Treatment/Exercise - 11/07/19 0001      Self-Care   Self-Care  Posture    Posture  instruction in sitting and standing posture to ensure neutral cervical/scapular positioning     Other Self-Care Comments   Instruction in proper posture and body mechanics with daily tasks;  pt. primarily noting pain still with washing hair       Neck Exercises: Theraband   Shoulder Extension  10 reps    Shoulder Extension Limitations  yellow TB in door     Rows  15 reps    Rows Limitations  yellow       Shoulder Exercises: Pulleys   Flexion  3 minutes    Scaption  3 minutes      Moist Heat Therapy   Number Minutes Moist Heat  15 Minutes    Moist Heat Location  Shoulder   B UT and  upper back      Acupuncturist Location  L upper trap    Electrical Stimulation Action  IFC    Electrical Stimulation Parameters  80-150Hz  intensity to tolerance, 15'    Electrical Stimulation Goals  Pain      Manual Therapy   Manual Therapy  Soft tissue mobilization;Myofascial release    Manual therapy comments  Seated     Soft tissue mobilization  STM/DTM to L UT, LS, L rhomboids     Myofascial Release  TPR to L mid trap       Neck Exercises: Stretches   Upper Trapezius Stretch  Left;Right;2 reps;30 seconds    Upper Trapezius Stretch Limitations  hands anchored on table     Levator Stretch  Right;Left;2 reps;30 seconds    Levator Stretch Limitations  hands anchored on table     Warehouse manager  30 seconds;3 reps    Corner Stretch Limitations  3-way doorway pec stretch    Other Neck Stretches  Seated B rhomboid stretch 2 x 30 sec               PT Short Term Goals - 11/07/19 0830      PT SHORT TERM GOAL #1   Title  Patient will be independent with initial HEP    Status  Achieved    Target Date  11/21/19       PT SHORT TERM GOAL #2   Title  Patient will verbalize/demonstrate good awareness of neutral spine adn shoulder posture and proper body mechanics for daily tasks    Status  On-going    Target Date  11/21/19        PT Long Term Goals - 11/04/19 1234      PT LONG TERM GOAL #1   Title  Patient will be independent with ongoing/advanced HEP    Status  On-going      PT LONG TERM GOAL #2   Title  Patient to improve neck & B shoudler AROM to WNL/WFL without pain provocation    Status  On-going      PT LONG TERM GOAL #3   Title  Patient will demonstrate improved L shoulder strength to >/= 4+/5 w/o pain provocation for improved activity tolerance    Status  On-going      PT LONG TERM GOAL #4   Title  Patient to report ability to complete hair care w/o limitation due to upper back or shoulder pain    Status  On-going            Plan - 11/07/19 0809    Clinical Impression Statement  Pt. reporting pain relief after last session which lasted over weekend.  Instructed pt. in possible purchase of home TENS 7000 unit as pt. noting good relief from E-stim.  Also checking for insurance coverage of TENS IT unit.  MT addressing L mid UT palpable TP which seemed to respond well to TPR.  Pt. noting relief from manual therapy and tolerated postural strengthening with yellow TB without increased pain.  Instructed pt. in posture and body mechanics handout (see pt. education section) to reduce cervical strain.  Ended visit with moist heat/E-sim to upper shoulders to reduce tension and post-exercise pain.    Comorbidities  neck, upper back and shoulder pain d/t mammary hypertrophy, DM, HTN    Rehab Potential  Good    PT Treatment/Interventions  ADLs/Self Care Home Management;Cryotherapy;Electrical Stimulation;Iontophoresis 4mg /ml Dexamethasone;Moist Heat;Traction;Ultrasound;Functional mobility training;Therapeutic activities;Therapeutic exercise;Neuromuscular re-education;Patient/family  education;Manual  techniques;Passive range of motion;Dry needling;Taping;Spinal Manipulations;Joint Manipulations;Vasopneumatic Device    PT Next Visit Plan  Postural stretchinng & strengthening; posture & body mechaincs educaiton; manual therapy & modalities PRN    PT Home Exercise Plan  10/31/19 - UT, LS, rhomboid & pec stretches, chin tuck, scap retraction    Consulted and Agree with Plan of Care  Patient       Patient will benefit from skilled therapeutic intervention in order to improve the following deficits and impairments:  Decreased activity tolerance, Decreased range of motion, Decreased strength, Increased fascial restricitons, Increased muscle spasms, Impaired perceived functional ability, Impaired flexibility, Impaired UE functional use, Improper body mechanics, Postural dysfunction, Pain  Visit Diagnosis: Pain in thoracic spine  Chronic left shoulder pain  Chronic right shoulder pain  Cervicalgia  Abnormal posture  Muscle weakness (generalized)     Problem List Patient Active Problem List   Diagnosis Date Noted  . Back pain 10/25/2019  . Neck pain 10/25/2019  . 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 Fibroid 07/10/2017  . Menorrhagia, premenopausal 07/09/2017    Bess Harvest, PTA 11/07/19 12:20 PM   [facility] [address]  [place], [place], [address] Phone: [phone]   Fax:  [phone]  Name: Eileen Larsen MRN: [id number] Date of Birth: 05-Oct-1970

## 2019-11-11 ENCOUNTER — Other Ambulatory Visit: Payer: Self-pay

## 2019-11-11 ENCOUNTER — Ambulatory Visit: Payer: BC Managed Care – PPO

## 2019-11-11 DIAGNOSIS — E611 Iron deficiency: Secondary | ICD-10-CM | POA: Diagnosis not present

## 2019-11-11 DIAGNOSIS — R293 Abnormal posture: Secondary | ICD-10-CM | POA: Diagnosis not present

## 2019-11-11 DIAGNOSIS — M25511 Pain in right shoulder: Secondary | ICD-10-CM

## 2019-11-11 DIAGNOSIS — M25512 Pain in left shoulder: Secondary | ICD-10-CM

## 2019-11-11 DIAGNOSIS — N951 Menopausal and female climacteric states: Secondary | ICD-10-CM | POA: Diagnosis not present

## 2019-11-11 DIAGNOSIS — M542 Cervicalgia: Secondary | ICD-10-CM

## 2019-11-11 DIAGNOSIS — R6882 Decreased libido: Secondary | ICD-10-CM | POA: Diagnosis not present

## 2019-11-11 DIAGNOSIS — M546 Pain in thoracic spine: Secondary | ICD-10-CM

## 2019-11-11 DIAGNOSIS — G8929 Other chronic pain: Secondary | ICD-10-CM | POA: Diagnosis not present

## 2019-11-11 DIAGNOSIS — E559 Vitamin D deficiency, unspecified: Secondary | ICD-10-CM | POA: Diagnosis not present

## 2019-11-11 DIAGNOSIS — R232 Flushing: Secondary | ICD-10-CM | POA: Diagnosis not present

## 2019-11-11 DIAGNOSIS — R5383 Other fatigue: Secondary | ICD-10-CM | POA: Diagnosis not present

## 2019-11-11 DIAGNOSIS — M6281 Muscle weakness (generalized): Secondary | ICD-10-CM | POA: Diagnosis not present

## 2019-11-11 DIAGNOSIS — Z6830 Body mass index (BMI) 30.0-30.9, adult: Secondary | ICD-10-CM | POA: Diagnosis not present

## 2019-11-11 DIAGNOSIS — E039 Hypothyroidism, unspecified: Secondary | ICD-10-CM | POA: Diagnosis not present

## 2019-11-11 NOTE — Patient Instructions (Addendum)
Trigger Point Dry Needling  . What is Trigger Point Dry Needling (DN)? o DN is a physical therapy technique used to treat muscle pain and dysfunction. Specifically, DN helps deactivate muscle trigger points (muscle knots).  o A thin filiform needle is used to penetrate the skin and stimulate the underlying trigger point. The goal is for a local twitch response (LTR) to occur and for the trigger point to relax. No medication of any kind is injected during the procedure.   . What Does Trigger Point Dry Needling Feel Like?  o The procedure feels different for each individual patient. Some patients report that they do not actually feel the needle enter the skin and overall the process is not painful. Very mild bleeding may occur. However, many patients feel a deep cramping in the muscle in which the needle was inserted. This is the local twitch response.   Marland Kitchen How Will I feel after the treatment? o Soreness is normal, and the onset of soreness may not occur for a few hours. Typically this soreness does not last longer than two days.  o Bruising is uncommon, however; ice can be used to decrease any possible bruising.  o In rare cases feeling tired or nauseous after the treatment is normal. In addition, your symptoms may get worse before they get better, this period will typically not last longer than 24 hours.   . What Can I do After My Treatment? o Increase your hydration by drinking more water for the next 24 hours. o You may place ice or heat on the areas treated that have become sore, however, do not use heat on inflamed or bruised areas. Heat often brings more relief post needling. o You can continue your regular activities, but vigorous activity is not recommended initially after the treatment for 24 hours. o DN is best combined with other physical therapy such as strengthening, stretching, and other therapies.   TENS stands for Transcutaneous Electrical Nerve Stimulation. In other words,  electrical impulses are allowed to pass through the skin in order to excite a nerve.   Purpose and Use of TENS:  TENS is a method used to manage acute and chronic pain without the use of drugs. It has been effective in managing pain associated with surgery, sprains, strains, trauma, rheumatoid arthritis, and neuralgias. It is a non-addictive, low risk, and non-invasive technique used to control pain. It is not, by any means, a curative form of treatment.   How TENS Works:  Most TENS units are a Paramedic unit powered by one 9 volt battery. Attached to the outside of the unit are two lead wires where two pins and/or snaps connect on each wire. All units come with a set of four reusable pads or electrodes. These are placed on the skin surrounding the area involved. By inserting the leads into  the pads, the electricity can pass from the unit making the circuit complete.  As the intensity is turned up slowly, the electrical current enters the body from the electrodes through the skin to the surrounding nerve fibers. This triggers the release of hormones from within the body. These hormones contain pain relievers. By increasing the circulation of these hormones, the person's pain may be lessened. It is also believed that the electrical stimulation itself helps to block the pain messages being sent to the brain, thus also decreasing the body's perception of pain.   Hazards:  TENS units are NOT to be used by patients with PACEMAKERS, DEFIBRILLATORS, DIABETIC  PUMPS, PREGNANT WOMEN, and patients with SEIZURE DISORDERS.  TENS units are NOT to be used over the heart, throat, brain, or spinal cord.  One of the major side effects from the TENS unit may be skin irritation. Some people may develop a rash if they are sensitive to the materials used in the electrodes or the connecting wires.   Wear the unit for 19min.   Avoid overuse due the body getting used to the stem making it not as effective over  time.   TENS UNIT  This is helpful for muscle pain and spasm.   Search and Purchase a TENS 7000 2nd edition at www.tenspros.com or www.amazon.com  (It should be less than $30)     TENS unit instructions:   Do not shower or bathe with the unit on  Turn the unit off before removing electrodes or batteries  If the electrodes lose stickiness add a drop of water to the electrodes after they are disconnected from the unit and place on plastic sheet. If you continued to have difficulty, call the TENS unit company to purchase more electrodes.  Do not apply lotion on the skin area prior to use. Make sure the skin is clean and dry as this will help prolong the life of the electrodes.  After use, always check skin for unusual red areas, rash or other skin difficulties. If there are any skin problems, does not apply electrodes to the same area.  Never remove the electrodes from the unit by pulling the wires.  Do not use the TENS unit or electrodes other than as directed.  Do not change electrode placement without consulting your therapist or physician.  Keep 2 fingers with between each electrode.

## 2019-11-11 NOTE — Therapy (Signed)
[facility] [address]  [place], [place], [address] Phone: [phone]   Fax:  [phone]  Physical Therapy Treatment  Patient Details  Name: Eileen Larsen MRN: [id number] Date of Birth: 1971/03/25 Referring Provider (PT): Audelia Hives, DO   Encounter Date: 11/11/2019  PT End of Session - 11/11/19 1110    Visit Number  4    Number of Visits  12    Date for PT Re-Evaluation  12/12/19    Authorization Type  BCBS - [place]: [place]    PT Start Time  1100    PT Stop Time  1206    PT Time Calculation (min)  66 min    Activity Tolerance  Patient tolerated treatment well    Behavior During Therapy  [facility] for tasks assessed/performed       Past Medical History:  Diagnosis Date  . Diabetes mellitus without complication ([place])   . Fibroids   . Hyperlipidemia   . Hypertension   . Kidney stone     Past Surgical History:  Procedure Laterality Date  . IR ANGIOGRAM PELVIS SELECTIVE OR SUPRASELECTIVE  07/09/2017  . IR ANGIOGRAM PELVIS SELECTIVE OR SUPRASELECTIVE  07/09/2017  . IR ANGIOGRAM SELECTIVE EACH ADDITIONAL VESSEL  07/09/2017  . IR ANGIOGRAM SELECTIVE EACH ADDITIONAL VESSEL  07/09/2017  . IR EMBO TUMOR ORGAN ISCHEMIA INFARCT INC GUIDE ROADMAPPING  07/09/2017  . IR RADIOLOGIST EVAL & MGMT  05/06/2017  . IR RADIOLOGIST EVAL & MGMT  08/05/2017  . IR RADIOLOGIST EVAL & MGMT  12/31/2017  . IR US GUIDE VASC ACCESS RIGHT  07/09/2017  . LITHOTRIPSY      There were no vitals filed for this visit.  Subjective Assessment - 11/11/19 1105    Subjective  Pt. reporting she received a call from an unknown number which may have been the TENS unit company however she did not answer.    Patient Stated Goals  "to not have the pain and fatigue when I have to have my hands up for ~40 minutes while washing my hair"    Currently in Pain?  Yes    Pain Score  0-No pain   Fatigue/discomfort getting up to 5/10 with washing hair in shower    Pain Location  Back    Pain Orientation  Upper    Pain Descriptors / Indicators  --   Sensation of  "heavy" feeling when washing hair in shower.   Pain Type  Chronic pain    Aggravating Factors   washing hair    Pain Relieving Factors  Has to take a few breaks in shower    Multiple Pain Sites  No                       [facility] Adult PT Treatment/Exercise - 11/11/19 0001      Self-Care   Self-Care  Other Self-Care Comments    Other Self-Care Comments   Instuction of home TENS 7000 2nd edition TENS unit, electrode placement, proper setup; still checking on insurance coverage for [employer] home TENS unit       Neck Exercises: Machines for Strengthening   UBE (Upper Arm Bike)  Lvl 1.0 , 3 min forwards/3 backwards     Cybex Row  20# x 15 reps       Neck Exercises: Seated   Other Seated Exercise  B scap retraction & depression 15 x 5"      Moist Heat  Therapy   Number Minutes Moist Heat  15 Minutes    Moist Heat Location  Shoulder      Electrical Stimulation   Electrical Stimulation Location  L upper trap    Electrical Stimulation Action  IFC    Electrical Stimulation Parameters  80-150Hz , intensity to pt. tolerance. 15'    Electrical Stimulation Goals  Pain      Manual Therapy   Manual Therapy  Soft tissue mobilization;Myofascial release    Manual therapy comments  Seated     Soft tissue mobilization  STM/DTM to L UT, LS, infra, teres group L rhomboids, R mid trap     Myofascial Release  TPR to L mid trap. R UT      Neck Exercises: Stretches   Upper Trapezius Stretch  Left;Right;2 reps;30 seconds    Upper Trapezius Stretch Limitations  hands anchored on table     Levator Stretch  Right;Left;2 reps;30 seconds    Levator Stretch Limitations  hands anchored on table     Corner Stretch  30 seconds;3 reps    Corner Stretch Limitations  mid in doorway single arm R and L    Pt. preferring single arm as she notes improved chest streth   Other Neck Stretches  B scalenes  stretch 2 x 30 sec              PT Education - 11/11/19 1157    Education Details  Handout for TENS unit 7000 2nd edition home tens unit, handout for TENS unit proper electrode placement and rationale    Person(s) Educated  Patient    Methods  Explanation;Demonstration;Verbal cues;Handout    Comprehension  Verbalized understanding;Returned demonstration;Verbal cues required       PT Short Term Goals - 11/07/19 0830      PT SHORT TERM GOAL #1   Title  Patient will be independent with initial HEP    Status  Achieved    Target Date  11/21/19      PT SHORT TERM GOAL #2   Title  Patient will verbalize/demonstrate good awareness of neutral spine adn shoulder posture and proper body mechanics for daily tasks    Status  On-going    Target Date  11/21/19        PT Long Term Goals - 11/04/19 1234      PT LONG TERM GOAL #1   Title  Patient will be independent with ongoing/advanced HEP    Status  On-going      PT LONG TERM GOAL #2   Title  Patient to improve neck & B shoudler AROM to WNL/WFL without pain provocation    Status  On-going      PT LONG TERM GOAL #3   Title  Patient will demonstrate improved L shoulder strength to >/= 4+/5 w/o pain provocation for improved activity tolerance    Status  On-going      PT LONG TERM GOAL #4   Title  Patient to report ability to complete hair care w/o limitation due to upper back or shoulder pain    Status  On-going            Plan - 11/11/19 1135    Clinical Impression Statement  Pt. reporting most benefit from TENS/moist heat combo in therapy.  Notes she is not having upper shoulder pain washing hair in shower only now having anterior chest fatigue/"heaviness" which still causes her to take breaks while washing hair (30-42min) in shower.  Continued with MT addressing  increased tension and tenderness in L upper shoulder musculature.  Progressed anterior chest stretching and initiated machine rowing for improved postural support.  Issued TENS 7000 2nd edition home TENS unit handout as pt. may be interested in purchasing this.  Still checking on insurance coverage for EMS[employer] home tens unit.  Pt. noting good progress with pain in therapy thus far.    Comorbidities  neck, upper back and shoulder pain d/t mammary hypertrophy, DM, HTN    Rehab Potential  Good    PT Treatment/Interventions  ADLs/Self Care Home Management;Cryotherapy;Electrical Stimulation;Iontophoresis 4mg /ml Dexamethasone;Moist Heat;Traction;Ultrasound;Functional mobility training;Therapeutic activities;Therapeutic exercise;Neuromuscular re-education;Patient/family education;Manual techniques;Passive range of motion;Dry needling;Taping;Spinal Manipulations;Joint Manipulations;Vasopneumatic Device    PT Next Visit Plan  Postural stretchinng & strengthening; posture & body mechaincs educaiton; manual therapy & modalities PRN    PT Home Exercise Plan  10/31/19 - UT, LS, rhomboid & pec stretches, chin tuck, scap retraction; row red band, extension yellow band    Consulted and Agree with Plan of Care  Patient       Patient will benefit from skilled therapeutic intervention in order to improve the following deficits and impairments:  Decreased activity tolerance, Decreased range of motion, Decreased strength, Increased fascial restricitons, Increased muscle spasms, Impaired perceived functional ability, Impaired flexibility, Impaired UE functional use, Improper body mechanics, Postural dysfunction, Pain  Visit Diagnosis: Pain in thoracic spine  Chronic left shoulder pain  Chronic right shoulder pain  Cervicalgia  Abnormal posture  Muscle weakness (generalized)     Problem List Patient Active Problem List   Diagnosis Date Noted  . Back pain 10/25/2019  . Neck pain 10/25/2019  . 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 Fibroid 07/10/2017  . Menorrhagia, premenopausal 07/09/2017    Bess Harvest, PTA 11/11/19 12:14 PM   [facility] [address]  [place], [place], [address] Phone: [phone]   Fax:  [phone]  Name: MYLIAH SPROWL MRN: [id number] Date of Birth: [birth year]

## 2019-11-14 ENCOUNTER — Ambulatory Visit: Payer: BC Managed Care – PPO | Attending: Plastic Surgery

## 2019-11-14 ENCOUNTER — Other Ambulatory Visit: Payer: Self-pay

## 2019-11-14 DIAGNOSIS — G8929 Other chronic pain: Secondary | ICD-10-CM | POA: Insufficient documentation

## 2019-11-14 DIAGNOSIS — M6281 Muscle weakness (generalized): Secondary | ICD-10-CM | POA: Diagnosis not present

## 2019-11-14 DIAGNOSIS — M546 Pain in thoracic spine: Secondary | ICD-10-CM | POA: Diagnosis not present

## 2019-11-14 DIAGNOSIS — M25512 Pain in left shoulder: Secondary | ICD-10-CM | POA: Diagnosis not present

## 2019-11-14 DIAGNOSIS — M25511 Pain in right shoulder: Secondary | ICD-10-CM | POA: Insufficient documentation

## 2019-11-14 DIAGNOSIS — M542 Cervicalgia: Secondary | ICD-10-CM | POA: Diagnosis not present

## 2019-11-14 DIAGNOSIS — R293 Abnormal posture: Secondary | ICD-10-CM | POA: Diagnosis not present

## 2019-11-14 NOTE — Therapy (Signed)
[facility] [address]  [place], [place], [address] Phone: [phone]   Fax:  [phone]  Physical Therapy Treatment  Patient Details  Name: Eileen Larsen MRN: [id number] Date of Birth: 23-Feb-1971 Referring Provider (PT): Audelia Hives, DO   Encounter Date: 11/14/2019  PT End of Session - 11/14/19 0807    Visit Number  5    Number of Visits  12    Date for PT Re-Evaluation  12/12/19    Authorization Type  BCBS - [place]: [place]    PT Start Time  0802    PT Stop Time  0830   Pt. reporting she had to leave early to make work meeting thus tx time limited   PT Time Calculation (min)  28 min    Activity Tolerance  Patient tolerated treatment well    Behavior During Therapy  [facility] for tasks assessed/performed       Past Medical History:  Diagnosis Date  . Diabetes mellitus without complication ([place])   . Fibroids   . Hyperlipidemia   . Hypertension   . Kidney stone     Past Surgical History:  Procedure Laterality Date  . IR ANGIOGRAM PELVIS SELECTIVE OR SUPRASELECTIVE  07/09/2017  . IR ANGIOGRAM PELVIS SELECTIVE OR SUPRASELECTIVE  07/09/2017  . IR ANGIOGRAM SELECTIVE EACH ADDITIONAL VESSEL  07/09/2017  . IR ANGIOGRAM SELECTIVE EACH ADDITIONAL VESSEL  07/09/2017  . IR EMBO TUMOR ORGAN ISCHEMIA INFARCT INC GUIDE ROADMAPPING  07/09/2017  . IR RADIOLOGIST EVAL & MGMT  05/06/2017  . IR RADIOLOGIST EVAL & MGMT  08/05/2017  . IR RADIOLOGIST EVAL & MGMT  12/31/2017  . IR US GUIDE VASC ACCESS RIGHT  07/09/2017  . LITHOTRIPSY      There were no vitals filed for this visit.  Subjective Assessment - 11/14/19 0805    Subjective  Pt. reporting she has to leave session early today to make work meeting.    Patient Stated Goals  "to not have the pain and fatigue when I have to have my hands up for ~40 minutes while washing my hair"    Currently in Pain?  Yes    Pain Score  2     Pain Location  Shoulder    Pain Orientation   Left;Right;Upper   L>R   Pain Descriptors / Indicators  Dull    Pain Type  Chronic pain    Pain Frequency  Intermittent    Multiple Pain Sites  No                       [facility] Adult PT Treatment/Exercise - 11/14/19 0001      Self-Care   Self-Care  Other Self-Care Comments    Posture  Discussion with pt. asking if she feels that she is more aware of her sitting and standing posture (focusing on retracted/shoulders back positioning); pt. reporting she is becoming more aware now at her desk and when standing of retracted posture and plans to "get a book" for under her feet to adjust to 90/90 sitting position    Other Self-Care Comments   Discussion of pt. current HEP to check for possibility of consolidation;  removed scapular retraction and chin tuck and added quadruped "bird dog" exercise for home      Neck Exercises: Machines for Strengthening   UBE (Upper Arm Bike)  Lvl 2.0 , 3 min forwards/3 backwards       Neck Exercises: Theraband  Shoulder Extension  10 reps;Red    Shoulder Extension Limitations  red TB closed high in door     Shoulder External Rotation  10 reps    Shoulder External Rotation Limitations  yellow TB leaning on doorseal     Horizontal ABduction  Red;10 reps   cues for scap. retraction/depression    Horizontal ABduction Limitations  leaning on doorseal       Lumbar Exercises: Quadruped   Single Arm Raise  Right;Left;10 reps;3 seconds    Single Arm Raises Limitations  in quadupred     Straight Leg Raise  10 reps;3 seconds    Straight Leg Raises Limitations  Cues for abdom. brace to avoid hyperlordosis of lumbar spine     Opposite Arm/Leg Raise  Right arm/Left leg;Left arm/Right leg;10 reps    Opposite Arm/Leg Raise Limitations  Cues for neutral spine                PT Short Term Goals - 11/07/19 0830      PT SHORT TERM GOAL #1   Title  Patient will be independent with initial HEP    Status  Achieved    Target Date  11/21/19      PT  SHORT TERM GOAL #2   Title  Patient will verbalize/demonstrate good awareness of neutral spine adn shoulder posture and proper body mechanics for daily tasks    Status  On-going    Target Date  11/21/19        PT Long Term Goals - 11/04/19 1234      PT LONG TERM GOAL #1   Title  Patient will be independent with ongoing/advanced HEP    Status  On-going      PT LONG TERM GOAL #2   Title  Patient to improve neck & B shoudler AROM to WNL/WFL without pain provocation    Status  On-going      PT LONG TERM GOAL #3   Title  Patient will demonstrate improved L shoulder strength to >/= 4+/5 w/o pain provocation for improved activity tolerance    Status  On-going      PT LONG TERM GOAL #4   Title  Patient to report ability to complete hair care w/o limitation due to upper back or shoulder pain    Status  On-going            Plan - 11/14/19 0809    Clinical Impression Statement  Pt. reporting she needs to leave early from session today to make a work meeting thus treatment time limited.  Consolidated HEP (removing scapular retraction, chin tuck and adding quadruped "bird dog") to reduce pt. HEP time daily as she feels she is beginning to spend too much time with HEP.  Pt. continues to report upper shoulder pain has nearly resolved however still with "heaviness"/"fatigue" when in shower washing her hair.  This fatigue while washing hair causes her to have to take breaks to complete task.  Session with progression of postural strengthening activities with addition of standing B shoulder ER with band, quadruped alternating LE/UE raise (Bird Dog exercise).  MT in previous visits indicating pt. may possibly benefit from trial of DN in L upper shoulder musculature.  Pt. has been issued DN educational handout last session.  Pt. left session pain free.    Comorbidities  neck, upper back and shoulder pain d/t mammary hypertrophy, DM, HTN    Rehab Potential  Good    PT Treatment/Interventions   ADLs/Self Care  Home Management;Cryotherapy;Electrical Stimulation;Iontophoresis 4mg /ml Dexamethasone;Moist Heat;Traction;Ultrasound;Functional mobility training;Therapeutic activities;Therapeutic exercise;Neuromuscular re-education;Patient/family education;Manual techniques;Passive range of motion;Dry needling;Taping;Spinal Manipulations;Joint Manipulations;Vasopneumatic Device    PT Next Visit Plan  Postural stretchinng & strengthening; posture & body mechaincs educaiton; manual therapy & modalities PRN    PT Home Exercise Plan  10/31/19 - UT, LS, rhomboid & pec stretches, chin tuck, scap retraction; row red band, extension yellow band    Consulted and Agree with Plan of Care  Patient       Patient will benefit from skilled therapeutic intervention in order to improve the following deficits and impairments:  Decreased activity tolerance, Decreased range of motion, Decreased strength, Increased fascial restricitons, Increased muscle spasms, Impaired perceived functional ability, Impaired flexibility, Impaired UE functional use, Improper body mechanics, Postural dysfunction, Pain  Visit Diagnosis: Pain in thoracic spine  Chronic left shoulder pain  Chronic right shoulder pain  Cervicalgia  Abnormal posture  Muscle weakness (generalized)     Problem List Patient Active Problem List   Diagnosis Date Noted  . Back pain 10/25/2019  . Neck pain 10/25/2019  . 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 Fibroid 07/10/2017  . Menorrhagia, premenopausal 07/09/2017    Bess Harvest, PTA 11/14/19 12:02 PM   [facility] [address]  [place], [place], [address] Phone: [phone]   Fax:  [phone]  Name: Eileen Larsen MRN: [id number] Date of Birth: [birth year]

## 2019-11-18 ENCOUNTER — Encounter: Payer: Self-pay | Admitting: Physical Therapy

## 2019-11-18 ENCOUNTER — Ambulatory Visit: Payer: BC Managed Care – PPO | Admitting: Physical Therapy

## 2019-11-18 ENCOUNTER — Other Ambulatory Visit: Payer: Self-pay

## 2019-11-18 DIAGNOSIS — M546 Pain in thoracic spine: Secondary | ICD-10-CM | POA: Diagnosis not present

## 2019-11-18 DIAGNOSIS — G8929 Other chronic pain: Secondary | ICD-10-CM | POA: Diagnosis not present

## 2019-11-18 DIAGNOSIS — M25511 Pain in right shoulder: Secondary | ICD-10-CM | POA: Diagnosis not present

## 2019-11-18 DIAGNOSIS — M25512 Pain in left shoulder: Secondary | ICD-10-CM | POA: Diagnosis not present

## 2019-11-18 DIAGNOSIS — M6281 Muscle weakness (generalized): Secondary | ICD-10-CM

## 2019-11-18 DIAGNOSIS — R293 Abnormal posture: Secondary | ICD-10-CM

## 2019-11-18 DIAGNOSIS — M542 Cervicalgia: Secondary | ICD-10-CM

## 2019-11-18 NOTE — Therapy (Addendum)
[facility] [address]  [place], [place], [address] Phone: [phone]   Fax:  [phone]  Physical Therapy Treatment / Discharge Summary  Patient Details  Name: Eileen Larsen MRN: [id number] Date of Birth: Jun 12, 1971 Referring Provider (PT): Audelia Hives, DO   Encounter Date: 11/18/2019  PT End of Session - 11/18/19 0806    Visit Number  6    Number of Visits  12    Date for PT Re-Evaluation  12/12/19    Authorization Type  BCBS - [place]: [place]    PT Start Time  0806    PT Stop Time  0906    PT Time Calculation (min)  60 min    Activity Tolerance  Patient tolerated treatment well    Behavior During Therapy  [facility] for tasks assessed/performed       Past Medical History:  Diagnosis Date  . Diabetes mellitus without complication ([place])   . Fibroids   . Hyperlipidemia   . Hypertension   . Kidney stone     Past Surgical History:  Procedure Laterality Date  . IR ANGIOGRAM PELVIS SELECTIVE OR SUPRASELECTIVE  07/09/2017  . IR ANGIOGRAM PELVIS SELECTIVE OR SUPRASELECTIVE  07/09/2017  . IR ANGIOGRAM SELECTIVE EACH ADDITIONAL VESSEL  07/09/2017  . IR ANGIOGRAM SELECTIVE EACH ADDITIONAL VESSEL  07/09/2017  . IR EMBO TUMOR ORGAN ISCHEMIA INFARCT INC GUIDE ROADMAPPING  07/09/2017  . IR RADIOLOGIST EVAL & MGMT  05/06/2017  . IR RADIOLOGIST EVAL & MGMT  08/05/2017  . IR RADIOLOGIST EVAL & MGMT  12/31/2017  . IR US GUIDE VASC ACCESS RIGHT  07/09/2017  . LITHOTRIPSY      There were no vitals filed for this visit.  Subjective Assessment - 11/18/19 0810    Subjective  Pt reporting "it's a brand new day" for her L shoulder - "the best it's ever felt".    Patient Stated Goals  "to not have the pain and fatigue when I have to have my hands up for ~40 minutes while washing my hair"    Currently in Pain?  No/denies                       [facility] Adult PT Treatment/Exercise - 11/18/19 0806      Neck  Exercises: Machines for Strengthening   UBE (Upper Arm Bike)  L2.0 x 6 min (3' fwd/3' back)      Neck Exercises: Theraband   Rows  15 reps;Red    Rows Limitations  cues for scap retraction & depression with chin tuck      Modalities   Modalities  Electrical Stimulation;Moist Heat      Moist Heat Therapy   Number Minutes Moist Heat  15 Minutes    Moist Heat Location  Shoulder   L UT and upper back      Electrical Stimulation   Electrical Stimulation Location  L upper/posterior shoulder complex    Electrical Stimulation Action  IFC    Electrical Stimulation Parameters  80-_0 , intensity to tolerance x 15'    Electrical Stimulation Goals  Pain;Tone      Manual Therapy   Manual Therapy  Soft tissue mobilization;Myofascial release    Manual therapy comments  prone    Soft tissue mobilization  STM/DTM to B UT, L LS, rhomboids and mid trap  - tender taut bands/TPs in L UT, LS and rhomboid    Myofascial Release  manual TPR to L  UT, LS & rhomboid      Neck Exercises: Stretches   Upper Trapezius Stretch  Left;30 seconds;2 reps    Levator Stretch  Left;30 seconds;2 reps    Other Neck Stretches  B rhomboid stretch 2 x 30"       Trigger Point Dry Needling - 11/18/19 0806    Consent Given?  Yes    Education Handout Provided  Previously provided    Muscles Treated Head and Neck  Upper trapezius;Levator scapulae   Left   Upper Trapezius Response  Twitch reponse elicited;Palpable increased muscle length    Levator Scapulae Response  Twitch response elicited;Palpable increased muscle length           PT Education - 11/18/19 0814    Education Details  Role of DN, expected response to treatment and recommended post-treatment activity level/exercises    Person(s) Educated  Patient    Methods  Explanation;Handout    Comprehension  Verbalized understanding       PT Short Term Goals - 11/18/19 0859      PT SHORT TERM GOAL #1   Title  Patient will be independent with initial HEP     Status  Achieved   11/07/19     PT SHORT TERM GOAL #2   Title  Patient will verbalize/demonstrate good awareness of neutral spine and shoulder posture and proper body mechanics for daily tasks    Status  On-going    Target Date  --        PT Long Term Goals - 11/04/19 1234      PT LONG TERM GOAL #1   Title  Patient will be independent with ongoing/advanced HEP    Status  On-going      PT LONG TERM GOAL #2   Title  Patient to improve neck & B shoudler AROM to WNL/WFL without pain provocation    Status  On-going      PT LONG TERM GOAL #3   Title  Patient will demonstrate improved L shoulder strength to >/= 4+/5 w/o pain provocation for improved activity tolerance    Status  On-going      PT LONG TERM GOAL #4   Title  Patient to report ability to complete hair care w/o limitation due to upper back or shoulder pain    Status  On-going            Plan - 11/18/19 0812    Clinical Impression Statement  Eileen Larsen reporting significant improvement in L shoulder pain since start of PT but still notes fatigue when she washes her hair. Continued increased muscle tension and ttp with taut bands/TPs persist in L UT, LS and rhomboids persists which was addressed with manual STM/DTM and MFR/TPR incorporating DN upon informed patient consent. Positive twitch response elicited with palpable reduction in muscle tension and ttp noted following manual therapy and DN. Reinforced normal muscle tension and activation with stretches and scapular strengthening/stabilization. Treatment concluded with IFC estim and moist heat to promote further muscle relaxation and pain reduction.    Comorbidities  neck, upper back and shoulder pain d/t mammary hypertrophy, DM, HTN    Rehab Potential  Good    PT Frequency  2x / week    PT Duration  4 weeks    PT Treatment/Interventions  ADLs/Self Care Home Management;Cryotherapy;Electrical Stimulation;Iontophoresis 26m/ml Dexamethasone;Moist  Heat;Traction;Ultrasound;Functional mobility training;Therapeutic activities;Therapeutic exercise;Neuromuscular re-education;Patient/family education;Manual techniques;Passive range of motion;Dry needling;Taping;Spinal Manipulations;Joint Manipulations;Vasopneumatic Device    PT Next Visit Plan  Assess response to  DN, postural stretchinng & strengthening; posture & body mechaincs educaiton; manual therapy & modalities PRN    PT Home Exercise Plan  10/31/19 - UT, LS, rhomboid & pec stretches, chin tuck, scap retraction; 11/11/19 - row red band, extension yellow band; 11/14/19 - bird dog    Consulted and Agree with Plan of Care  Patient       Patient will benefit from skilled therapeutic intervention in order to improve the following deficits and impairments:  Decreased activity tolerance, Decreased range of motion, Decreased strength, Increased fascial restricitons, Increased muscle spasms, Impaired perceived functional ability, Impaired flexibility, Impaired UE functional use, Improper body mechanics, Postural dysfunction, Pain  Visit Diagnosis: Pain in thoracic spine  Chronic left shoulder pain  Chronic right shoulder pain  Cervicalgia  Abnormal posture  Muscle weakness (generalized)     Problem List Patient Active Problem List   Diagnosis Date Noted  . Back pain 10/25/2019  . Neck pain 10/25/2019  . 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 Fibroid 07/10/2017  . Menorrhagia, premenopausal 07/09/2017    Percival Spanish, PT, MPT 11/18/2019, 9:24 AM  [facility] [address]  [place], [place], [address] Phone: [phone]   Fax:  [phone]  Name: Eileen Larsen MRN: [id number] Date of Birth: 1971-03-16  PHYSICAL THERAPY DISCHARGE SUMMARY  Visits from Start of Care: 6  Current functional level related to goals / functional outcomes:   Refer to above clinical impression for status as of last visit on  11/18/2019. A of 12/01/2019, patient called leaving a message to cancel all future appointments without reason given and attempts to f/u with her were unsuccessful, therefore will proceed with discharge from PT for this episode.   Remaining deficits:  As above. Unable to formally assess status at discharge due to patient failure to return to PT.   Education / Equipment:   HEP  Plan: Patient agrees to discharge.  Patient goals were partially met. Patient is being discharged due to the patient's request.  ?????     Percival Spanish, PT, MPT 01/12/20, 10:39 AM  [facility] [address]  [place], [place], [address] Phone: [phone]   Fax:  [phone]

## 2019-11-21 ENCOUNTER — Ambulatory Visit: Payer: BC Managed Care – PPO

## 2019-11-25 ENCOUNTER — Ambulatory Visit: Payer: BC Managed Care – PPO | Admitting: Physical Therapy

## 2019-11-28 ENCOUNTER — Ambulatory Visit: Payer: BC Managed Care – PPO | Admitting: Physical Therapy

## 2019-11-28 DIAGNOSIS — K5140 Inflammatory polyps of colon without complications: Secondary | ICD-10-CM | POA: Diagnosis not present

## 2019-11-28 DIAGNOSIS — D120 Benign neoplasm of cecum: Secondary | ICD-10-CM | POA: Diagnosis not present

## 2019-11-28 DIAGNOSIS — Z1211 Encounter for screening for malignant neoplasm of colon: Secondary | ICD-10-CM | POA: Diagnosis not present

## 2019-11-28 DIAGNOSIS — D123 Benign neoplasm of transverse colon: Secondary | ICD-10-CM | POA: Diagnosis not present

## 2019-11-28 DIAGNOSIS — R194 Change in bowel habit: Secondary | ICD-10-CM | POA: Diagnosis not present

## 2019-11-28 DIAGNOSIS — K635 Polyp of colon: Secondary | ICD-10-CM | POA: Diagnosis not present

## 2019-12-02 ENCOUNTER — Ambulatory Visit: Payer: BC Managed Care – PPO

## 2019-12-09 ENCOUNTER — Encounter: Payer: BC Managed Care – PPO | Admitting: Physical Therapy

## 2019-12-16 ENCOUNTER — Encounter: Payer: BC Managed Care – PPO | Admitting: Physical Therapy

## 2019-12-19 DIAGNOSIS — Z6829 Body mass index (BMI) 29.0-29.9, adult: Secondary | ICD-10-CM | POA: Diagnosis not present

## 2019-12-19 DIAGNOSIS — N951 Menopausal and female climacteric states: Secondary | ICD-10-CM | POA: Diagnosis not present

## 2019-12-19 DIAGNOSIS — E559 Vitamin D deficiency, unspecified: Secondary | ICD-10-CM | POA: Diagnosis not present

## 2019-12-21 DIAGNOSIS — E119 Type 2 diabetes mellitus without complications: Secondary | ICD-10-CM | POA: Diagnosis not present

## 2019-12-21 DIAGNOSIS — E785 Hyperlipidemia, unspecified: Secondary | ICD-10-CM | POA: Diagnosis not present

## 2019-12-21 DIAGNOSIS — D649 Anemia, unspecified: Secondary | ICD-10-CM | POA: Diagnosis not present

## 2019-12-21 DIAGNOSIS — I10 Essential (primary) hypertension: Secondary | ICD-10-CM | POA: Diagnosis not present

## 2020-01-26 ENCOUNTER — Encounter: Payer: BC Managed Care – PPO | Admitting: Plastic Surgery

## 2020-02-14 ENCOUNTER — Other Ambulatory Visit (HOSPITAL_COMMUNITY): Payer: BC Managed Care – PPO

## 2020-02-14 DIAGNOSIS — Z6830 Body mass index (BMI) 30.0-30.9, adult: Secondary | ICD-10-CM | POA: Diagnosis not present

## 2020-02-14 DIAGNOSIS — E559 Vitamin D deficiency, unspecified: Secondary | ICD-10-CM | POA: Diagnosis not present

## 2020-02-14 DIAGNOSIS — E611 Iron deficiency: Secondary | ICD-10-CM | POA: Diagnosis not present

## 2020-02-24 ENCOUNTER — Encounter: Payer: Self-pay | Admitting: Plastic Surgery

## 2020-02-24 ENCOUNTER — Encounter: Payer: Self-pay | Admitting: Surgical

## 2020-02-24 ENCOUNTER — Other Ambulatory Visit: Payer: Self-pay

## 2020-02-24 ENCOUNTER — Ambulatory Visit (INDEPENDENT_AMBULATORY_CARE_PROVIDER_SITE_OTHER): Payer: BC Managed Care – PPO | Admitting: Surgical

## 2020-02-24 VITALS — BP 134/90 | HR 86 | Temp 97.5°F | Ht 66.0 in | Wt 187.2 lb

## 2020-02-24 DIAGNOSIS — M546 Pain in thoracic spine: Secondary | ICD-10-CM

## 2020-02-24 DIAGNOSIS — N62 Hypertrophy of breast: Secondary | ICD-10-CM

## 2020-02-24 DIAGNOSIS — G8929 Other chronic pain: Secondary | ICD-10-CM

## 2020-02-24 DIAGNOSIS — M542 Cervicalgia: Secondary | ICD-10-CM

## 2020-02-24 MED ORDER — ONDANSETRON HCL 4 MG PO TABS: 4.0000 mg | ORAL_TABLET | Freq: Three times a day (TID) | ORAL | 0 refills | Status: DC | PRN

## 2020-02-24 MED ORDER — HYDROCODONE-ACETAMINOPHEN 5-325 MG PO TABS: 1.0000 | ORAL_TABLET | Freq: Four times a day (QID) | ORAL | 0 refills | Status: AC | PRN

## 2020-02-24 MED ORDER — CEPHALEXIN 500 MG PO CAPS: 500.0000 mg | ORAL_CAPSULE | Freq: Four times a day (QID) | ORAL | 0 refills | Status: AC

## 2020-02-24 NOTE — Progress Notes (Signed)
Patient ID: Eileen Larsen, female    DOB: 1970/12/28, 49 y.o.   MRN: [id number]  Chief Complaint  Patient presents with  . Pre-op Exam      ICD-10-CM   1. Chronic bilateral thoracic back pain  M54.6    G89.29   2. Symptomatic mammary hypertrophy  N62   3. Neck pain  M54.2      History of Present Illness: Eileen Larsen is a 49 y.o.  female  with a history of enlarged breasts causing neck and back pain.  She presents for preoperative evaluation for upcoming procedure, bilateral breast reduction with liposuction, scheduled for 03/15/2020 with Dr. Marla Roe.  She reports anesthesia in the past has mostly been fine, she does report one history of slow to wake up.  No other complications.  No personal or family history of DVT/PE.  No personal or family history of bleeding or clotting disorders.  Patient does not take any blood thinners.  Patient is not currently taking any OCPs.  She does report that she had a hormone injection approximately 3 to 4 months ago for fatigue.  Summary of Previous Visit: Patient's breasts are extremely large and fairly symmetric, STN on the right is 34 cm and STN on the left is 34 cm.  IMF distance is 16 cm.  Patient is 5 feet 6 inches tall and weighs 182 pounds.  Preop bra size is 81F and she would like to be a C or D cup.  Estimated excess breast tissue to remove the time of surgery is 575 g on the left and 575 g on the right.  Since patient's previous visit in February 2021 she has had physical therapy.  Patient had a mammogram in November 2020 which was negative.  Results personally viewed in EMR today.  Patient reports in regards to her job she is planning to take a week off after surgery and then returned to work from home for the second weeks postop.  She is planning to return to work in office after 2 weeks.  PMH Significant for: Hypertension, hyperlipidemia, type 2 diabetes mellitus.  Most recent A1c per patient in the high 6s.  Past Medical  History: Allergies: No Known Allergies  Current Medications:  Current Outpatient Medications:  .  amLODipine (NORVASC) 5 MG tablet, Take 5 mg by mouth daily., Disp: , Rfl:  .  atorvastatin (LIPITOR) 20 MG tablet, Take 20 mg by mouth daily., Disp: , Rfl:  .  hydrochlorothiazide (HYDRODIURIL) 25 MG tablet, Take 25 mg by mouth daily., Disp: , Rfl:  .  ibuprofen (ADVIL,MOTRIN) 800 MG tablet, Take 1 tablet (800 mg total) by mouth 3 (three) times daily., Disp: 21 tablet, Rfl: 0 .  metFORMIN (GLUCOPHAGE) 500 MG tablet, Take 1,000 mg by mouth 2 (two) times daily with a meal. , Disp: , Rfl:  .  JARDIANCE 10 MG TABS tablet, Take 10 mg by mouth daily. (Patient not taking: Reported on 02/24/2020), Disp: , Rfl:  .  liothyronine (CYTOMEL) 5 MCG tablet, TAKE 1 TABLET FOR 7 DAYS, THEN INCREASE TO 2 TABLETS A DAY (Patient not taking: Reported on 02/24/2020), Disp: , Rfl:   Past Medical Problems: Past Medical History:  Diagnosis Date  . Diabetes mellitus without complication ([place])   . Fibroids   . Hyperlipidemia   . Hypertension   . Kidney stone     Past Surgical History: Past Surgical History:  Procedure Laterality Date  . IR ANGIOGRAM PELVIS SELECTIVE OR SUPRASELECTIVE  07/09/2017  . IR ANGIOGRAM PELVIS SELECTIVE OR SUPRASELECTIVE  07/09/2017  . IR ANGIOGRAM SELECTIVE EACH ADDITIONAL VESSEL  07/09/2017  . IR ANGIOGRAM SELECTIVE EACH ADDITIONAL VESSEL  07/09/2017  . IR EMBO TUMOR ORGAN ISCHEMIA INFARCT INC GUIDE ROADMAPPING  07/09/2017  . IR RADIOLOGIST EVAL & MGMT  05/06/2017  . IR RADIOLOGIST EVAL & MGMT  08/05/2017  . IR RADIOLOGIST EVAL & MGMT  12/31/2017  . IR US GUIDE VASC ACCESS RIGHT  07/09/2017  . LITHOTRIPSY      Social History: Social History   Socioeconomic History  . Marital status: Married    Spouse name: Not on file  . Number of children: Not on file  . Years of education: Not on file  . Highest education level: Not on file  Occupational History  . Not on file  Tobacco  Use  . Smoking status: Never Smoker  . Smokeless tobacco: Never Used  Substance and Sexual Activity  . Alcohol use: Yes    Comment: rare  . Drug use: No  . Sexual activity: Yes    Birth control/protection: Coitus interruptus  Other Topics Concern  . Not on file  Social History Narrative  . Not on file   Social Determinants of Health   Financial Resource Strain:   . Difficulty of Paying Living Expenses:   Food Insecurity:   . Worried About Charity fundraiser in the Last Year:   . Arboriculturist in the Last Year:   Transportation Needs:   . Film/video editor (Medical):   Marland Kitchen Lack of Transportation (Non-Medical):   Physical Activity:   . Days of Exercise per Week:   . Minutes of Exercise per Session:   Stress:   . Feeling of Stress :   Social Connections:   . Frequency of Communication with Friends and Family:   . Frequency of Social Gatherings with Friends and Family:   . Attends Religious Services:   . Active Member of Clubs or Organizations:   . Attends Archivist Meetings:   Marland Kitchen Marital Status:   Intimate Partner Violence:   . Fear of Current or Ex-Partner:   . Emotionally Abused:   Marland Kitchen Physically Abused:   . Sexually Abused:     Family History: No family history on file.  Review of Systems: Review of Systems  Constitutional: Negative.   Respiratory: Negative.   Cardiovascular: Negative.   Musculoskeletal: Positive for back pain and neck pain.    Physical Exam: Vital Signs BP 134/90 (BP Location: Left Arm, Patient Position: Sitting, Cuff Size: Normal)   Pulse 86   Temp (!) 97.5 F (36.4 C) (Temporal)   Ht 5\' 6"  (1.676 m)   Wt 187 lb 3.2 oz (84.9 kg)   LMP 02/18/2020 (Exact Date)   SpO2 (!) 89%   BMI 30.21 kg/m  Physical Exam Constitutional:      General: She is not in acute distress.    Appearance: Normal appearance. She is not ill-appearing.  HENT:     Head: Normocephalic and atraumatic.  Eyes:     Pupils: Pupils are equal,  round Neck:     Musculoskeletal: Normal range of motion.  Cardiovascular:     Rate and Rhythm: Normal rate and regular rhythm.     Pulses: Normal pulses.     Heart sounds: Normal heart sounds. No murmur.  Pulmonary:     Effort: Pulmonary effort is normal. No respiratory distress.     Breath sounds: Normal breath sounds.  No wheezing.  Abdominal:     General: Abdomen is flat. There is no distension.     Palpations: Abdomen is soft.     Tenderness: There is no abdominal tenderness.  Musculoskeletal: Normal range of motion.  Skin:    General: Skin is warm and dry.     Findings: No erythema or rash.  Neurological:     General: No focal deficit present.     Mental Status: She is alert and oriented to person, place, and time. Mental status is at baseline.     Motor: No weakness.  Psychiatric:        Mood and Affect: Mood normal.        Behavior: Behavior normal.    Assessment/Plan: The patient is scheduled for bilateral breast reduction with liposuction with Dr. Marla Roe on 03/15/2020.  Risks, benefits, and alternatives of procedure discussed, questions answered and consent obtained.    Smoking Status: Non-smoker Last Mammogram: November 2020; Results: Negative  Caprini Score: Moderate; Risk Factors include: Age, BMI greater than 25, and length of planned surgery. Recommendation for mechanical prophylaxis during surgery. Encourage early ambulation.   Pictures obtained: 10/25/2019  Post-op Rx sent to pharmacy: Keflex, Norco, Zofran  Patient was provided with the breast reduction and General Surgical Risk consent document and Pain Medication Agreement prior to their appointment.  They had adequate time to read through the risk consent documents and Pain Medication Agreement. We also discussed them in person together during this preop appointment. All of their questions were answered to their satisfaction.  Recommended calling if they have any further questions.  Risk consent form and Pain  Medication Agreement to be scanned into patient's chart.  The risk that can be encountered with breast reduction were discussed and include the following but not limited to these:  Breast asymmetry, fluid accumulation, firmness of the breast, inability to breast feed, loss of nipple or areola, skin loss, decrease or no nipple sensation, fat necrosis of the breast tissue, bleeding, infection, healing delay.  There are risks of anesthesia, changes to skin sensation and injury to nerves or blood vessels.  The muscle can be temporarily or permanently injured.  You may have an allergic reaction to tape, suture, glue, blood products which can result in skin discoloration, swelling, pain, skin lesions, poor healing.  Any of these can lead to the need for revisonal surgery or stage procedures.  A reduction has potential to interfere with diagnostic procedures.  Nipple or breast piercing can increase risks of infection.  This procedure is best done when the breast is fully developed.  Changes in the breast will continue to occur over time.  Pregnancy can alter the outcomes of previous breast reduction surgery, weight gain and weigh loss can also effect the long term appearance.     Call with any questions or concerns.   Electronically signed by: Carola Rhine Julliana Whitmyer, PA-C 02/24/2020 3:33 PM

## 2020-03-09 ENCOUNTER — Encounter: Payer: BC Managed Care – PPO | Admitting: Surgical

## 2020-03-12 ENCOUNTER — Other Ambulatory Visit (HOSPITAL_COMMUNITY): Payer: BC Managed Care – PPO

## 2020-03-15 ENCOUNTER — Ambulatory Visit (HOSPITAL_BASED_OUTPATIENT_CLINIC_OR_DEPARTMENT_OTHER): Admit: 2020-03-15 | Payer: BC Managed Care – PPO | Admitting: Plastic Surgery

## 2020-03-15 ENCOUNTER — Encounter (HOSPITAL_BASED_OUTPATIENT_CLINIC_OR_DEPARTMENT_OTHER): Payer: Self-pay

## 2020-03-15 SURGERY — BREAST REDUCTION WITH LIPOSUCTION
Anesthesia: General | Site: Breast | Laterality: Bilateral

## 2020-03-23 ENCOUNTER — Encounter: Payer: BC Managed Care – PPO | Admitting: Plastic Surgery

## 2020-04-06 ENCOUNTER — Encounter: Payer: BC Managed Care – PPO | Admitting: Plastic Surgery

## 2020-05-15 ENCOUNTER — Ambulatory Visit (INDEPENDENT_AMBULATORY_CARE_PROVIDER_SITE_OTHER): Payer: BC Managed Care – PPO | Admitting: Plastic Surgery

## 2020-05-15 ENCOUNTER — Encounter: Payer: Self-pay | Admitting: Plastic Surgery

## 2020-05-15 ENCOUNTER — Other Ambulatory Visit: Payer: Self-pay

## 2020-05-15 VITALS — BP 132/84 | Temp 98.0°F | Ht 66.0 in | Wt 172.8 lb

## 2020-05-15 DIAGNOSIS — N62 Hypertrophy of breast: Secondary | ICD-10-CM

## 2020-05-15 DIAGNOSIS — G8929 Other chronic pain: Secondary | ICD-10-CM

## 2020-05-15 DIAGNOSIS — M542 Cervicalgia: Secondary | ICD-10-CM

## 2020-05-15 DIAGNOSIS — E119 Type 2 diabetes mellitus without complications: Secondary | ICD-10-CM

## 2020-05-15 DIAGNOSIS — M546 Pain in thoracic spine: Secondary | ICD-10-CM

## 2020-05-15 MED ORDER — ACETAMINOPHEN 500 MG PO TABS: 500.0000 mg | ORAL_TABLET | Freq: Four times a day (QID) | ORAL | 0 refills | Status: AC | PRN

## 2020-05-15 MED ORDER — ONDANSETRON HCL 4 MG PO TABS: 4.0000 mg | ORAL_TABLET | Freq: Three times a day (TID) | ORAL | 0 refills | Status: DC | PRN

## 2020-05-15 MED ORDER — HYDROCODONE-ACETAMINOPHEN 5-325 MG PO TABS
1.0000 | ORAL_TABLET | Freq: Three times a day (TID) | ORAL | 0 refills | Status: AC | PRN
Start: 2020-05-15 — End: 2020-05-22

## 2020-05-15 MED ORDER — IBUPROFEN 600 MG PO TABS: 600.0000 mg | ORAL_TABLET | Freq: Four times a day (QID) | ORAL | 0 refills | Status: AC | PRN

## 2020-05-15 MED ORDER — CEPHALEXIN 500 MG PO CAPS: 500.0000 mg | ORAL_CAPSULE | Freq: Four times a day (QID) | ORAL | 0 refills | Status: AC

## 2020-05-15 NOTE — Progress Notes (Signed)
ICD-10-CM   1. Symptomatic mammary hypertrophy  N62 Hemoglobin A1c  2. Neck pain  M54.2   3. Chronic bilateral thoracic back pain  M54.6    G89.29   4. Diabetes mellitus without complication ([place])  W96.0 Hemoglobin A1c      Patient ID: Eileen Larsen, female    DOB: 1971/01/07, 49 y.o.   MRN: [id number]   History of Present Illness: Eileen Larsen is a 49 y.o.  female  with a history of symptomatic mammary hyperplasia.  She presents for preoperative evaluation for upcoming procedure, bilateral breast reduction with liposuction, scheduled for 05/31/2020 with Dr. Marla Roe.  Summary from previous visit: Patient's breasts are extremely large and fairly symmetric.  Sternal to nipple distance on the right is 34 cm and the left is 34 cm.  The IMF distance is 16 cm.  She is 5 feet 6 inches tall and weighs 182 pounds.  Preoperative bra size is 27 F and she would like to be a full C/D cup.  The estimated excess breast tissue to be removed at the time of surgery is 575 g from each side.   Job: Office work mostly Systems developer components  PMH Significant for: HTN, HLD, T2DM (A1c was 7.1 in January 2021.) Provided order to patient to get A1c tested at [facility].  The patient has not had problems with anesthesia. Reports she has not had any issues with recent surgeries. She did wake up slowly from a surgery several years ago.   Past Medical History: Allergies: No Known Allergies  Current Medications:  Current Outpatient Medications:  .  acetaminophen (TYLENOL) 500 MG tablet, Take 1 tablet (500 mg total) by mouth every 6 (six) hours as needed. For use AFTER surgery, Disp: 30 tablet, Rfl: 0 .  amLODipine (NORVASC) 5 MG tablet, Take 5 mg by mouth daily., Disp: , Rfl:  .  atorvastatin (LIPITOR) 20 MG tablet, Take 20 mg by mouth daily., Disp: , Rfl:  .  cephALEXin (KEFLEX) 500 MG capsule, Take 1 capsule (500 mg total) by mouth 4 (four) times daily for 3 days. For use AFTER Surgery, Disp: 12  capsule, Rfl: 0 .  hydrochlorothiazide (HYDRODIURIL) 25 MG tablet, Take 25 mg by mouth daily., Disp: , Rfl:  .  HYDROcodone-acetaminophen (NORCO) 5-325 MG tablet, Take 1 tablet by mouth every 8 (eight) hours as needed for up to 7 days for severe pain. For use AFTER Surgery, Disp: 21 tablet, Rfl: 0 .  ibuprofen (ADVIL) 600 MG tablet, Take 1 tablet (600 mg total) by mouth every 6 (six) hours as needed for mild pain or moderate pain. For use AFTER surgery, Disp: 30 tablet, Rfl: 0 .  ibuprofen (ADVIL,MOTRIN) 800 MG tablet, Take 1 tablet (800 mg total) by mouth 3 (three) times daily., Disp: 21 tablet, Rfl: 0 .  JARDIANCE 10 MG TABS tablet, Take 10 mg by mouth daily. (Patient not taking: Reported on 02/24/2020), Disp: , Rfl:  .  liothyronine (CYTOMEL) 5 MCG tablet, TAKE 1 TABLET FOR 7 DAYS, THEN INCREASE TO 2 TABLETS A DAY (Patient not taking: Reported on 02/24/2020), Disp: , Rfl:  .  metFORMIN (GLUCOPHAGE) 500 MG tablet, Take 1,000 mg by mouth 2 (two) times daily with a meal. , Disp: , Rfl:  .  ondansetron (ZOFRAN) 4 MG tablet, Take 1 tablet (4 mg total) by mouth every 8 (eight) hours as needed for nausea or vomiting., Disp: 20 tablet, Rfl: 0  Past Medical Problems: Past Medical History:  Diagnosis Date  . Diabetes mellitus without complication ([place])   . Fibroids   . Hyperlipidemia   . Hypertension   . Kidney stone     Past Surgical History: Past Surgical History:  Procedure Laterality Date  . IR ANGIOGRAM PELVIS SELECTIVE OR SUPRASELECTIVE  07/09/2017  . IR ANGIOGRAM PELVIS SELECTIVE OR SUPRASELECTIVE  07/09/2017  . IR ANGIOGRAM SELECTIVE EACH ADDITIONAL VESSEL  07/09/2017  . IR ANGIOGRAM SELECTIVE EACH ADDITIONAL VESSEL  07/09/2017  . IR EMBO TUMOR ORGAN ISCHEMIA INFARCT INC GUIDE ROADMAPPING  07/09/2017  . IR RADIOLOGIST EVAL & MGMT  05/06/2017  . IR RADIOLOGIST EVAL & MGMT  08/05/2017  . IR RADIOLOGIST EVAL & MGMT  12/31/2017  . IR US GUIDE VASC ACCESS RIGHT  07/09/2017  . LITHOTRIPSY       Social History: Social History   Socioeconomic History  . Marital status: Married    Spouse name: Not on file  . Number of children: Not on file  . Years of education: Not on file  . Highest education level: Not on file  Occupational History  . Not on file  Tobacco Use  . Smoking status: Never Smoker  . Smokeless tobacco: Never Used  Substance and Sexual Activity  . Alcohol use: Yes    Comment: rare  . Drug use: No  . Sexual activity: Yes    Birth control/protection: Coitus interruptus  Other Topics Concern  . Not on file  Social History Narrative  . Not on file   Social Determinants of Health   Financial Resource Strain:   . Difficulty of Paying Living Expenses: Not on file  Food Insecurity:   . Worried About Charity fundraiser in the Last Year: Not on file  . Ran Out of Food in the Last Year: Not on file  Transportation Needs:   . Lack of Transportation (Medical): Not on file  . Lack of Transportation (Non-Medical): Not on file  Physical Activity:   . Days of Exercise per Week: Not on file  . Minutes of Exercise per Session: Not on file  Stress:   . Feeling of Stress : Not on file  Social Connections:   . Frequency of Communication with Friends and Family: Not on file  . Frequency of Social Gatherings with Friends and Family: Not on file  . Attends Religious Services: Not on file  . Active Member of Clubs or Organizations: Not on file  . Attends Archivist Meetings: Not on file  . Marital Status: Not on file  Intimate Partner Violence:   . Fear of Current or Ex-Partner: Not on file  . Emotionally Abused: Not on file  . Physically Abused: Not on file  . Sexually Abused: Not on file    Family History: History reviewed. No pertinent family history.  Review of Systems: Review of Systems  Constitutional: Negative for chills and fever.  HENT: Negative for congestion and sore throat.   Respiratory: Negative for cough and shortness of breath.    Cardiovascular: Negative for chest pain and palpitations.  Gastrointestinal: Negative for abdominal pain, nausea and vomiting.  Musculoskeletal: Positive for back pain and neck pain. Negative for joint pain and myalgias.  Skin: Negative for itching and rash.    Physical Exam: Vital Signs BP 132/84 (BP Location: Left Arm, Patient Position: Sitting, Cuff Size: Large)   Temp 98 F (36.7 C) (Oral)   Ht 5\' 6"  (1.676 m)   Wt 172 lb 12.8 oz (78.4 kg)   BMI  27.89 kg/m  Physical Exam Vitals and nursing note reviewed.  Constitutional:      Appearance: Normal appearance. She is normal weight.  HENT:     Head: Normocephalic and atraumatic.  Eyes:     Extraocular Movements: Extraocular movements intact.  Cardiovascular:     Rate and Rhythm: Normal rate and regular rhythm.     Pulses: Normal pulses.     Heart sounds: Normal heart sounds.  Pulmonary:     Effort: Pulmonary effort is normal.     Breath sounds: Normal breath sounds. No wheezing, rhonchi or rales.  Abdominal:     General: Bowel sounds are normal.     Palpations: Abdomen is soft.  Musculoskeletal:        General: No swelling. Normal range of motion.     Cervical back: Normal range of motion.  Skin:    General: Skin is warm and dry.     Coloration: Skin is not pale.     Findings: No erythema or rash.  Neurological:     General: No focal deficit present.     Mental Status: She is alert and oriented to person, place, and time.  Psychiatric:        Mood and Affect: Mood normal.        Behavior: Behavior normal.        Thought Content: Thought content normal.        Judgment: Judgment normal.     Assessment/Plan:  Ms. Banales scheduled for bilateral breast reduction with liposuction with Dr. Marla Roe.  Risks, benefits, and alternatives of procedure discussed, questions answered and consent obtained.    Smoking Status: non-smoker; Counseling Given? N/A Last Mammogram: 08/09/2019; Results: No significant  mammographic findings.  Caprini Score: 5 High; Risk Factors include: 49 year old female, HRT,  BMI > 25, and length of planned surgery. Recommendation for mechanical and pharmacological prophylaxis during surgery. Encourage early ambulation.   Pictures obtained: 10/25/2019  Post-op Rx sent to pharmacy: Zofran, Keflex, Norco, Ibu 600 mg, Tylenol 500 mg  Patient was provided with the breast reduction risks and General Surgical Risk consent document and Pain Medication Agreement prior to their appointment.  They had adequate time to read through the risk consent documents and Pain Medication Agreement. We also discussed them in person together during this preop appointment. All of their questions were answered to their satisfaction.  Recommended calling if they have any further questions.  Risk consent form and Pain Medication Agreement to be scanned into patient's chart.  The risk that can be encountered with breast reduction were discussed and include the following but not limited to these:  Breast asymmetry, fluid accumulation, firmness of the breast, inability to breast feed, loss of nipple or areola, skin loss, decrease or no nipple sensation, fat necrosis of the breast tissue, bleeding, infection, healing delay.  There are risks of anesthesia, changes to skin sensation and injury to nerves or blood vessels.  The muscle can be temporarily or permanently injured.  You may have an allergic reaction to tape, suture, glue, blood products which can result in skin discoloration, swelling, pain, skin lesions, poor healing.  Any of these can lead to the need for revisonal surgery or stage procedures.  A reduction has potential to interfere with diagnostic procedures.  Nipple or breast piercing can increase risks of infection.  This procedure is best done when the breast is fully developed.  Changes in the breast will continue to occur over time.  Pregnancy can alter the outcomes  of previous breast reduction  surgery, weight gain and weigh loss can also effect the long term appearance.   The risks that can be encountered with and after liposuction were discussed and include the following but no limited to these:  Asymmetry, fluid accumulation, firmness of the area, fat necrosis with death of fat tissue, bleeding, infection, delayed healing, anesthesia risks, skin sensation changes, injury to structures including nerves, blood vessels, and muscles which may be temporary or permanent, allergies to tape, suture materials and glues, blood products, topical preparations or injected agents, skin and contour irregularities, skin discoloration and swelling, deep vein thrombosis, cardiac and pulmonary complications, pain, which may persist, persistent pain, recurrence of the lesion, poor healing of the incision, possible need for revisional surgery or staged procedures. Thiere can also be persistent swelling, poor wound healing, rippling or loose skin, worsening of cellulite, swelling, and thermal burn or heat injury from ultrasound with the ultrasound-assisted lipoplasty technique. Any change in weight fluctuations can alter the outcome.  The [place] was signed into law in 2016 which includes the topic of electronic health records.  This provides immediate access to information in MyChart.  This includes consultation notes, operative notes, office notes, lab results and pathology reports.  If you have any questions about what you read please let us know at your next visit or call us at the office.  We are right here with you.   Electronically signed by: Threasa Heads, PA-C 05/15/2020 4:25 PM

## 2020-05-15 NOTE — H&P (View-Only) (Signed)
ICD-10-CM   1. Symptomatic mammary hypertrophy  N62 Hemoglobin A1c  2. Neck pain  M54.2   3. Chronic bilateral thoracic back pain  M54.6    G89.29   4. Diabetes mellitus without complication ([place])  L93.7 Hemoglobin A1c      Patient ID: Eileen Larsen, female    DOB: 08-14-1971, 49 y.o.   MRN: [id number]   History of Present Illness: Eileen Larsen is a 49 y.o.  female  with a history of symptomatic mammary hyperplasia.  She presents for preoperative evaluation for upcoming procedure, bilateral breast reduction with liposuction, scheduled for 05/31/2020 with Dr. Marla Roe.  Summary from previous visit: Patient's breasts are extremely large and fairly symmetric.  Sternal to nipple distance on the right is 34 cm and the left is 34 cm.  The IMF distance is 16 cm.  She is 5 feet 6 inches tall and weighs 182 pounds.  Preoperative bra size is 3 F and she would like to be a full C/D cup.  The estimated excess breast tissue to be removed at the time of surgery is 575 g from each side.   Job: Office work mostly Systems developer components  PMH Significant for: HTN, HLD, T2DM (A1c was 7.1 in January 2021.) Provided order to patient to get A1c tested at [facility].  The patient has not had problems with anesthesia. Reports she has not had any issues with recent surgeries. She did wake up slowly from a surgery several years ago.   Past Medical History: Allergies: No Known Allergies  Current Medications:  Current Outpatient Medications:  .  acetaminophen (TYLENOL) 500 MG tablet, Take 1 tablet (500 mg total) by mouth every 6 (six) hours as needed. For use AFTER surgery, Disp: 30 tablet, Rfl: 0 .  amLODipine (NORVASC) 5 MG tablet, Take 5 mg by mouth daily., Disp: , Rfl:  .  atorvastatin (LIPITOR) 20 MG tablet, Take 20 mg by mouth daily., Disp: , Rfl:  .  cephALEXin (KEFLEX) 500 MG capsule, Take 1 capsule (500 mg total) by mouth 4 (four) times daily for 3 days. For use AFTER Surgery, Disp: 12  capsule, Rfl: 0 .  hydrochlorothiazide (HYDRODIURIL) 25 MG tablet, Take 25 mg by mouth daily., Disp: , Rfl:  .  HYDROcodone-acetaminophen (NORCO) 5-325 MG tablet, Take 1 tablet by mouth every 8 (eight) hours as needed for up to 7 days for severe pain. For use AFTER Surgery, Disp: 21 tablet, Rfl: 0 .  ibuprofen (ADVIL) 600 MG tablet, Take 1 tablet (600 mg total) by mouth every 6 (six) hours as needed for mild pain or moderate pain. For use AFTER surgery, Disp: 30 tablet, Rfl: 0 .  ibuprofen (ADVIL,MOTRIN) 800 MG tablet, Take 1 tablet (800 mg total) by mouth 3 (three) times daily., Disp: 21 tablet, Rfl: 0 .  JARDIANCE 10 MG TABS tablet, Take 10 mg by mouth daily. (Patient not taking: Reported on 02/24/2020), Disp: , Rfl:  .  liothyronine (CYTOMEL) 5 MCG tablet, TAKE 1 TABLET FOR 7 DAYS, THEN INCREASE TO 2 TABLETS A DAY (Patient not taking: Reported on 02/24/2020), Disp: , Rfl:  .  metFORMIN (GLUCOPHAGE) 500 MG tablet, Take 1,000 mg by mouth 2 (two) times daily with a meal. , Disp: , Rfl:  .  ondansetron (ZOFRAN) 4 MG tablet, Take 1 tablet (4 mg total) by mouth every 8 (eight) hours as needed for nausea or vomiting., Disp: 20 tablet, Rfl: 0  Past Medical Problems: Past Medical History:  Diagnosis Date  . Diabetes mellitus without complication ([place])   . Fibroids   . Hyperlipidemia   . Hypertension   . Kidney stone     Past Surgical History: Past Surgical History:  Procedure Laterality Date  . IR ANGIOGRAM PELVIS SELECTIVE OR SUPRASELECTIVE  07/09/2017  . IR ANGIOGRAM PELVIS SELECTIVE OR SUPRASELECTIVE  07/09/2017  . IR ANGIOGRAM SELECTIVE EACH ADDITIONAL VESSEL  07/09/2017  . IR ANGIOGRAM SELECTIVE EACH ADDITIONAL VESSEL  07/09/2017  . IR EMBO TUMOR ORGAN ISCHEMIA INFARCT INC GUIDE ROADMAPPING  07/09/2017  . IR RADIOLOGIST EVAL & MGMT  05/06/2017  . IR RADIOLOGIST EVAL & MGMT  08/05/2017  . IR RADIOLOGIST EVAL & MGMT  12/31/2017  . IR US GUIDE VASC ACCESS RIGHT  07/09/2017  . LITHOTRIPSY       Social History: Social History   Socioeconomic History  . Marital status: Married    Spouse name: Not on file  . Number of children: Not on file  . Years of education: Not on file  . Highest education level: Not on file  Occupational History  . Not on file  Tobacco Use  . Smoking status: Never Smoker  . Smokeless tobacco: Never Used  Substance and Sexual Activity  . Alcohol use: Yes    Comment: rare  . Drug use: No  . Sexual activity: Yes    Birth control/protection: Coitus interruptus  Other Topics Concern  . Not on file  Social History Narrative  . Not on file   Social Determinants of Health   Financial Resource Strain:   . Difficulty of Paying Living Expenses: Not on file  Food Insecurity:   . Worried About Charity fundraiser in the Last Year: Not on file  . Ran Out of Food in the Last Year: Not on file  Transportation Needs:   . Lack of Transportation (Medical): Not on file  . Lack of Transportation (Non-Medical): Not on file  Physical Activity:   . Days of Exercise per Week: Not on file  . Minutes of Exercise per Session: Not on file  Stress:   . Feeling of Stress : Not on file  Social Connections:   . Frequency of Communication with Friends and Family: Not on file  . Frequency of Social Gatherings with Friends and Family: Not on file  . Attends Religious Services: Not on file  . Active Member of Clubs or Organizations: Not on file  . Attends Archivist Meetings: Not on file  . Marital Status: Not on file  Intimate Partner Violence:   . Fear of Current or Ex-Partner: Not on file  . Emotionally Abused: Not on file  . Physically Abused: Not on file  . Sexually Abused: Not on file    Family History: History reviewed. No pertinent family history.  Review of Systems: Review of Systems  Constitutional: Negative for chills and fever.  HENT: Negative for congestion and sore throat.   Respiratory: Negative for cough and shortness of breath.    Cardiovascular: Negative for chest pain and palpitations.  Gastrointestinal: Negative for abdominal pain, nausea and vomiting.  Musculoskeletal: Positive for back pain and neck pain. Negative for joint pain and myalgias.  Skin: Negative for itching and rash.    Physical Exam: Vital Signs BP 132/84 (BP Location: Left Arm, Patient Position: Sitting, Cuff Size: Large)   Temp 98 F (36.7 C) (Oral)   Ht 5\' 6"  (1.676 m)   Wt 172 lb 12.8 oz (78.4 kg)   BMI  27.89 kg/m  Physical Exam Vitals and nursing note reviewed.  Constitutional:      Appearance: Normal appearance. She is normal weight.  HENT:     Head: Normocephalic and atraumatic.  Eyes:     Extraocular Movements: Extraocular movements intact.  Cardiovascular:     Rate and Rhythm: Normal rate and regular rhythm.     Pulses: Normal pulses.     Heart sounds: Normal heart sounds.  Pulmonary:     Effort: Pulmonary effort is normal.     Breath sounds: Normal breath sounds. No wheezing, rhonchi or rales.  Abdominal:     General: Bowel sounds are normal.     Palpations: Abdomen is soft.  Musculoskeletal:        General: No swelling. Normal range of motion.     Cervical back: Normal range of motion.  Skin:    General: Skin is warm and dry.     Coloration: Skin is not pale.     Findings: No erythema or rash.  Neurological:     General: No focal deficit present.     Mental Status: She is alert and oriented to person, place, and time.  Psychiatric:        Mood and Affect: Mood normal.        Behavior: Behavior normal.        Thought Content: Thought content normal.        Judgment: Judgment normal.     Assessment/Plan:  Eileen Larsen scheduled for bilateral breast reduction with liposuction with Dr. Marla Roe.  Risks, benefits, and alternatives of procedure discussed, questions answered and consent obtained.    Smoking Status: non-smoker; Counseling Given? N/A Last Mammogram: 08/09/2019; Results: No significant  mammographic findings.  Caprini Score: 5 High; Risk Factors include: 49 year old female, HRT,  BMI > 25, and length of planned surgery. Recommendation for mechanical and pharmacological prophylaxis during surgery. Encourage early ambulation.   Pictures obtained: 10/25/2019  Post-op Rx sent to pharmacy: Zofran, Keflex, Norco, Ibu 600 mg, Tylenol 500 mg  Patient was provided with the breast reduction risks and General Surgical Risk consent document and Pain Medication Agreement prior to their appointment.  They had adequate time to read through the risk consent documents and Pain Medication Agreement. We also discussed them in person together during this preop appointment. All of their questions were answered to their satisfaction.  Recommended calling if they have any further questions.  Risk consent form and Pain Medication Agreement to be scanned into patient's chart.  The risk that can be encountered with breast reduction were discussed and include the following but not limited to these:  Breast asymmetry, fluid accumulation, firmness of the breast, inability to breast feed, loss of nipple or areola, skin loss, decrease or no nipple sensation, fat necrosis of the breast tissue, bleeding, infection, healing delay.  There are risks of anesthesia, changes to skin sensation and injury to nerves or blood vessels.  The muscle can be temporarily or permanently injured.  You may have an allergic reaction to tape, suture, glue, blood products which can result in skin discoloration, swelling, pain, skin lesions, poor healing.  Any of these can lead to the need for revisonal surgery or stage procedures.  A reduction has potential to interfere with diagnostic procedures.  Nipple or breast piercing can increase risks of infection.  This procedure is best done when the breast is fully developed.  Changes in the breast will continue to occur over time.  Pregnancy can alter the outcomes  of previous breast reduction  surgery, weight gain and weigh loss can also effect the long term appearance.   The risks that can be encountered with and after liposuction were discussed and include the following but no limited to these:  Asymmetry, fluid accumulation, firmness of the area, fat necrosis with death of fat tissue, bleeding, infection, delayed healing, anesthesia risks, skin sensation changes, injury to structures including nerves, blood vessels, and muscles which may be temporary or permanent, allergies to tape, suture materials and glues, blood products, topical preparations or injected agents, skin and contour irregularities, skin discoloration and swelling, deep vein thrombosis, cardiac and pulmonary complications, pain, which may persist, persistent pain, recurrence of the lesion, poor healing of the incision, possible need for revisional surgery or staged procedures. Thiere can also be persistent swelling, poor wound healing, rippling or loose skin, worsening of cellulite, swelling, and thermal burn or heat injury from ultrasound with the ultrasound-assisted lipoplasty technique. Any change in weight fluctuations can alter the outcome.  The [place] was signed into law in 2016 which includes the topic of electronic health records.  This provides immediate access to information in MyChart.  This includes consultation notes, operative notes, office notes, lab results and pathology reports.  If you have any questions about what you read please let us know at your next visit or call us at the office.  We are right here with you.   Electronically signed by: Threasa Heads, PA-C 05/15/2020 4:25 PM

## 2020-05-17 DIAGNOSIS — E119 Type 2 diabetes mellitus without complications: Secondary | ICD-10-CM | POA: Diagnosis not present

## 2020-05-17 DIAGNOSIS — N62 Hypertrophy of breast: Secondary | ICD-10-CM | POA: Diagnosis not present

## 2020-05-18 LAB — HEMOGLOBIN A1C
Est. average glucose Bld gHb Est-mCnc: 143 mg/dL
Hgb A1c MFr Bld: 6.6 % — ABNORMAL HIGH (ref 4.8–5.6)

## 2020-05-28 ENCOUNTER — Encounter (HOSPITAL_BASED_OUTPATIENT_CLINIC_OR_DEPARTMENT_OTHER): Payer: Self-pay | Admitting: Plastic Surgery

## 2020-05-28 ENCOUNTER — Other Ambulatory Visit: Payer: Self-pay

## 2020-05-29 ENCOUNTER — Encounter (HOSPITAL_BASED_OUTPATIENT_CLINIC_OR_DEPARTMENT_OTHER)
Admission: RE | Admit: 2020-05-29 | Discharge: 2020-05-29 | Disposition: A | Discharge status: Home / Self Care | Payer: BC Managed Care – PPO | Source: Ambulatory Visit | Attending: Plastic Surgery | Admitting: Plastic Surgery

## 2020-05-29 ENCOUNTER — Other Ambulatory Visit (HOSPITAL_COMMUNITY)
Admission: RE | Admit: 2020-05-29 | Discharge: 2020-05-29 | Disposition: A | Discharge status: Home / Self Care | Payer: BC Managed Care – PPO | Source: Ambulatory Visit | Attending: Plastic Surgery | Admitting: Plastic Surgery

## 2020-05-29 DIAGNOSIS — Z01818 Encounter for other preprocedural examination: Secondary | ICD-10-CM | POA: Insufficient documentation

## 2020-05-29 DIAGNOSIS — Z20822 Contact with and (suspected) exposure to covid-19: Secondary | ICD-10-CM | POA: Insufficient documentation

## 2020-05-29 DIAGNOSIS — I10 Essential (primary) hypertension: Secondary | ICD-10-CM | POA: Insufficient documentation

## 2020-05-29 LAB — BASIC METABOLIC PANEL
Anion gap: 11 (ref 5–15)
BUN: 12 mg/dL (ref 6–20)
CO2: 26 mmol/L (ref 22–32)
Calcium: 9.2 mg/dL (ref 8.9–10.3)
Chloride: 101 mmol/L (ref 98–111)
Creatinine, Ser: 0.74 mg/dL (ref 0.44–1.00)
GFR calc Af Amer: >60 mL/min (ref >60)
GFR calc non Af Amer: >60 mL/min (ref >60)
Glucose, Bld: 143 mg/dL — ABNORMAL HIGH (ref 70–99)
Potassium: 4 mmol/L (ref 3.5–5.1)
Sodium: 138 mmol/L (ref 135–145)

## 2020-05-29 LAB — SARS CORONAVIRUS 2 (TAT 6-24 HRS): SARS Coronavirus 2: NEGATIVE

## 2020-05-31 ENCOUNTER — Ambulatory Visit (HOSPITAL_BASED_OUTPATIENT_CLINIC_OR_DEPARTMENT_OTHER): Payer: BC Managed Care – PPO | Admitting: Anesthesiology

## 2020-05-31 ENCOUNTER — Encounter (HOSPITAL_BASED_OUTPATIENT_CLINIC_OR_DEPARTMENT_OTHER): Payer: Self-pay | Admitting: Plastic Surgery

## 2020-05-31 ENCOUNTER — Encounter (HOSPITAL_BASED_OUTPATIENT_CLINIC_OR_DEPARTMENT_OTHER)
Admission: RE | Disposition: A | Discharge status: Home / Self Care | Payer: Self-pay | Source: Home / Self Care | Attending: Plastic Surgery

## 2020-05-31 ENCOUNTER — Other Ambulatory Visit: Payer: Self-pay

## 2020-05-31 ENCOUNTER — Ambulatory Visit (HOSPITAL_BASED_OUTPATIENT_CLINIC_OR_DEPARTMENT_OTHER)
Admission: RE | Admit: 2020-05-31 | Discharge: 2020-05-31 | Disposition: A | Discharge status: Home / Self Care | Payer: BC Managed Care – PPO | Attending: Plastic Surgery | Admitting: Plastic Surgery

## 2020-05-31 DIAGNOSIS — I10 Essential (primary) hypertension: Secondary | ICD-10-CM | POA: Diagnosis not present

## 2020-05-31 DIAGNOSIS — N6011 Diffuse cystic mastopathy of right breast: Secondary | ICD-10-CM | POA: Diagnosis not present

## 2020-05-31 DIAGNOSIS — N6012 Diffuse cystic mastopathy of left breast: Secondary | ICD-10-CM | POA: Diagnosis not present

## 2020-05-31 DIAGNOSIS — N6082 Other benign mammary dysplasias of left breast: Secondary | ICD-10-CM | POA: Diagnosis not present

## 2020-05-31 DIAGNOSIS — M549 Dorsalgia, unspecified: Secondary | ICD-10-CM | POA: Diagnosis not present

## 2020-05-31 DIAGNOSIS — Z7984 Long term (current) use of oral hypoglycemic drugs: Secondary | ICD-10-CM | POA: Diagnosis not present

## 2020-05-31 DIAGNOSIS — Z79899 Other long term (current) drug therapy: Secondary | ICD-10-CM | POA: Diagnosis not present

## 2020-05-31 DIAGNOSIS — N62 Hypertrophy of breast: Secondary | ICD-10-CM | POA: Insufficient documentation

## 2020-05-31 DIAGNOSIS — M542 Cervicalgia: Secondary | ICD-10-CM | POA: Insufficient documentation

## 2020-05-31 DIAGNOSIS — G8929 Other chronic pain: Secondary | ICD-10-CM | POA: Diagnosis not present

## 2020-05-31 DIAGNOSIS — E785 Hyperlipidemia, unspecified: Secondary | ICD-10-CM | POA: Diagnosis not present

## 2020-05-31 DIAGNOSIS — E119 Type 2 diabetes mellitus without complications: Secondary | ICD-10-CM | POA: Diagnosis not present

## 2020-05-31 DIAGNOSIS — M546 Pain in thoracic spine: Secondary | ICD-10-CM | POA: Diagnosis not present

## 2020-05-31 HISTORY — DX: Anemia, unspecified: D64.9

## 2020-05-31 HISTORY — PX: BREAST REDUCTION SURGERY: SHX8

## 2020-05-31 LAB — POCT PREGNANCY, URINE: Preg Test, Ur: NEGATIVE

## 2020-05-31 LAB — GLUCOSE, CAPILLARY
Glucose-Capillary: 107 mg/dL — ABNORMAL HIGH (ref 70–99)
Glucose-Capillary: 104 mg/dL — ABNORMAL HIGH (ref 70–99)

## 2020-05-31 SURGERY — BREAST REDUCTION WITH LIPOSUCTION
Anesthesia: General | Site: Breast | Laterality: Bilateral

## 2020-05-31 MED ORDER — SUCCINYLCHOLINE CHLORIDE 200 MG/10ML IV SOSY
PREFILLED_SYRINGE | INTRAVENOUS | Status: AC
  Filled 2020-05-31: qty 10

## 2020-05-31 MED ORDER — ACETAMINOPHEN 10 MG/ML IV SOLN
1000.0000 mg | Freq: Once | INTRAVENOUS | Status: AC
  Administered 2020-05-31: 1000 mg via INTRAVENOUS

## 2020-05-31 MED ORDER — LACTATED RINGERS IV SOLN: INTRAVENOUS | Status: DC

## 2020-05-31 MED ORDER — SODIUM CHLORIDE 0.9% FLUSH: 3.0000 mL | INTRAVENOUS | Status: DC | PRN

## 2020-05-31 MED ORDER — CEFAZOLIN SODIUM-DEXTROSE 2-4 GM/100ML-% IV SOLN
2.0000 g | INTRAVENOUS | Status: AC
  Administered 2020-05-31: 2 g via INTRAVENOUS

## 2020-05-31 MED ORDER — FENTANYL CITRATE (PF) 100 MCG/2ML IJ SOLN
INTRAMUSCULAR | Status: AC
  Filled 2020-05-31: qty 2

## 2020-05-31 MED ORDER — ONDANSETRON HCL 4 MG/2ML IJ SOLN
INTRAMUSCULAR | Status: AC
  Filled 2020-05-31: qty 2

## 2020-05-31 MED ORDER — FENTANYL CITRATE (PF) 100 MCG/2ML IJ SOLN
INTRAMUSCULAR | Status: DC | PRN
Start: 2020-05-31 — End: 2020-05-31
  Administered 2020-05-31 (×4): 50 ug via INTRAVENOUS
  Administered 2020-05-31: 100 ug via INTRAVENOUS
  Administered 2020-05-31 (×2): 50 ug via INTRAVENOUS

## 2020-05-31 MED ORDER — ACETAMINOPHEN 325 MG PO TABS: 650.0000 mg | ORAL_TABLET | ORAL | Status: DC | PRN

## 2020-05-31 MED ORDER — ONDANSETRON HCL 4 MG/2ML IJ SOLN
INTRAMUSCULAR | Status: DC | PRN
  Administered 2020-05-31: 4 mg via INTRAVENOUS

## 2020-05-31 MED ORDER — LIDOCAINE 2% (20 MG/ML) 5 ML SYRINGE
INTRAMUSCULAR | Status: AC
  Filled 2020-05-31: qty 5

## 2020-05-31 MED ORDER — DEXAMETHASONE SODIUM PHOSPHATE 4 MG/ML IJ SOLN
INTRAMUSCULAR | Status: DC | PRN
  Administered 2020-05-31: 10 mg via INTRAVENOUS

## 2020-05-31 MED ORDER — EPHEDRINE 5 MG/ML INJ
INTRAVENOUS | Status: AC
  Filled 2020-05-31: qty 10

## 2020-05-31 MED ORDER — OXYCODONE HCL 5 MG PO TABS: 5.0000 mg | ORAL_TABLET | ORAL | Status: DC | PRN

## 2020-05-31 MED ORDER — CEFAZOLIN SODIUM-DEXTROSE 2-4 GM/100ML-% IV SOLN
INTRAVENOUS | Status: AC
  Filled 2020-05-31: qty 100

## 2020-05-31 MED ORDER — LIDOCAINE 2% (20 MG/ML) 5 ML SYRINGE
INTRAMUSCULAR | Status: DC | PRN
  Administered 2020-05-31: 50 mg via INTRAVENOUS

## 2020-05-31 MED ORDER — AMISULPRIDE (ANTIEMETIC) 5 MG/2ML IV SOLN: 10.0000 mg | Freq: Once | INTRAVENOUS | Status: DC | PRN

## 2020-05-31 MED ORDER — SODIUM BICARBONATE 4 % IV SOLN
INTRAVENOUS | Status: DC | PRN
  Administered 2020-05-31: 400 mL via INTRAMUSCULAR

## 2020-05-31 MED ORDER — PHENYLEPHRINE 40 MCG/ML (10ML) SYRINGE FOR IV PUSH (FOR BLOOD PRESSURE SUPPORT)
PREFILLED_SYRINGE | INTRAVENOUS | Status: AC
  Filled 2020-05-31: qty 10

## 2020-05-31 MED ORDER — BUPIVACAINE HCL (PF) 0.25 % IJ SOLN
INTRAMUSCULAR | Status: DC | PRN
  Administered 2020-05-31: 30 mL

## 2020-05-31 MED ORDER — MORPHINE SULFATE (PF) 4 MG/ML IV SOLN: 1.0000 mg | INTRAVENOUS | Status: DC | PRN

## 2020-05-31 MED ORDER — DIPHENHYDRAMINE HCL 50 MG/ML IJ SOLN
INTRAMUSCULAR | Status: DC | PRN
  Administered 2020-05-31: 12.5 mg via INTRAVENOUS

## 2020-05-31 MED ORDER — SODIUM CHLORIDE 0.9% FLUSH: 3.0000 mL | Freq: Two times a day (BID) | INTRAVENOUS | Status: DC

## 2020-05-31 MED ORDER — OXYCODONE HCL 5 MG/5ML PO SOLN: 5.0000 mg | Freq: Once | ORAL | Status: AC | PRN

## 2020-05-31 MED ORDER — MEPERIDINE HCL 25 MG/ML IJ SOLN: 6.2500 mg | INTRAMUSCULAR | Status: DC | PRN

## 2020-05-31 MED ORDER — HYDROMORPHONE HCL 1 MG/ML IJ SOLN: 0.2500 mg | INTRAMUSCULAR | Status: DC | PRN

## 2020-05-31 MED ORDER — CHLORHEXIDINE GLUCONATE CLOTH 2 % EX PADS: 6.0000 | MEDICATED_PAD | Freq: Once | CUTANEOUS | Status: DC

## 2020-05-31 MED ORDER — ACETAMINOPHEN 10 MG/ML IV SOLN
INTRAVENOUS | Status: AC
  Filled 2020-05-31: qty 100

## 2020-05-31 MED ORDER — DEXAMETHASONE SODIUM PHOSPHATE 10 MG/ML IJ SOLN
INTRAMUSCULAR | Status: AC
  Filled 2020-05-31: qty 1

## 2020-05-31 MED ORDER — DIPHENHYDRAMINE HCL 50 MG/ML IJ SOLN
INTRAMUSCULAR | Status: AC
  Filled 2020-05-31: qty 1

## 2020-05-31 MED ORDER — OXYCODONE HCL 5 MG PO TABS
ORAL_TABLET | ORAL | Status: AC
  Filled 2020-05-31: qty 1

## 2020-05-31 MED ORDER — MIDAZOLAM HCL 5 MG/5ML IJ SOLN
INTRAMUSCULAR | Status: DC | PRN
  Administered 2020-05-31: 2 mg via INTRAVENOUS

## 2020-05-31 MED ORDER — ACETAMINOPHEN 325 MG RE SUPP: 650.0000 mg | RECTAL | Status: DC | PRN

## 2020-05-31 MED ORDER — PROPOFOL 10 MG/ML IV BOLUS
INTRAVENOUS | Status: DC | PRN
  Administered 2020-05-31: 180 mg via INTRAVENOUS

## 2020-05-31 MED ORDER — PROMETHAZINE HCL 25 MG/ML IJ SOLN: 6.2500 mg | INTRAMUSCULAR | Status: DC | PRN

## 2020-05-31 MED ORDER — MIDAZOLAM HCL 2 MG/2ML IJ SOLN
INTRAMUSCULAR | Status: AC
  Filled 2020-05-31: qty 2

## 2020-05-31 MED ORDER — OXYCODONE HCL 5 MG PO TABS
5.0000 mg | ORAL_TABLET | Freq: Once | ORAL | Status: AC | PRN
  Administered 2020-05-31: 5 mg via ORAL

## 2020-05-31 MED ORDER — SODIUM CHLORIDE 0.9 % IV SOLN: 250.0000 mL | INTRAVENOUS | Status: DC | PRN

## 2020-05-31 MED ORDER — LIDOCAINE-EPINEPHRINE 1 %-1:100000 IJ SOLN
INTRAMUSCULAR | Status: DC | PRN
  Administered 2020-05-31: 20 mL

## 2020-05-31 SURGICAL SUPPLY — 66 items
ADH SKN CLS APL DERMABOND .7 (GAUZE/BANDAGES/DRESSINGS)
BAG DECANTER FOR FLEXI CONT (MISCELLANEOUS) ×2 IMPLANT
BINDER BREAST LRG (GAUZE/BANDAGES/DRESSINGS) IMPLANT
BINDER BREAST MEDIUM (GAUZE/BANDAGES/DRESSINGS) IMPLANT
BINDER BREAST XLRG (GAUZE/BANDAGES/DRESSINGS) IMPLANT
BINDER BREAST XXLRG (GAUZE/BANDAGES/DRESSINGS) IMPLANT
BIOPATCH RED 1 DISK 7.0 (GAUZE/BANDAGES/DRESSINGS) IMPLANT
BLADE HEX COATED 2.75 (ELECTRODE) ×2 IMPLANT
BLADE KNIFE PERSONA 10 (BLADE) ×6 IMPLANT
BLADE SURG 15 STRL LF DISP TIS (BLADE) IMPLANT
BLADE SURG 15 STRL SS (BLADE)
BNDG GAUZE ELAST 4 BULKY (GAUZE/BANDAGES/DRESSINGS) IMPLANT
CANISTER SUCT 1200ML W/VALVE (MISCELLANEOUS) ×2 IMPLANT
COVER BACK TABLE 60X90IN (DRAPES) ×2 IMPLANT
COVER MAYO STAND STRL (DRAPES) ×2 IMPLANT
COVER WAND RF STERILE (DRAPES) IMPLANT
DECANTER SPIKE VIAL GLASS SM (MISCELLANEOUS) IMPLANT
DERMABOND ADVANCED (GAUZE/BANDAGES/DRESSINGS)
DERMABOND ADVANCED .7 DNX12 (GAUZE/BANDAGES/DRESSINGS) IMPLANT
DRAIN CHANNEL 19F RND (DRAIN) IMPLANT
DRAPE LAPAROSCOPIC ABDOMINAL (DRAPES) ×2 IMPLANT
DRSG OPSITE POSTOP 4X6 (GAUZE/BANDAGES/DRESSINGS) ×2 IMPLANT
DRSG PAD ABDOMINAL 8X10 ST (GAUZE/BANDAGES/DRESSINGS) ×4 IMPLANT
ELECT BLADE 4.0 EZ CLEAN MEGAD (MISCELLANEOUS)
ELECT REM PT RETURN 9FT ADLT (ELECTROSURGICAL) ×2
ELECTRODE BLDE 4.0 EZ CLN MEGD (MISCELLANEOUS) IMPLANT
ELECTRODE REM PT RTRN 9FT ADLT (ELECTROSURGICAL) ×1 IMPLANT
EVACUATOR SILICONE 100CC (DRAIN) IMPLANT
GLOVE BIO SURGEON STRL SZ 6.5 (GLOVE) ×8 IMPLANT
GOWN STRL REUS W/ TWL LRG LVL3 (GOWN DISPOSABLE) ×2 IMPLANT
GOWN STRL REUS W/TWL LRG LVL3 (GOWN DISPOSABLE) ×6
NDL HYPO 25X1 1.5 SAFETY (NEEDLE) ×1 IMPLANT
NDL SAFETY ECLIPSE 18X1.5 (NEEDLE) IMPLANT
NEEDLE HYPO 18GX1.5 SHARP (NEEDLE)
NEEDLE HYPO 25X1 1.5 SAFETY (NEEDLE) ×2 IMPLANT
NS IRRIG 1000ML POUR BTL (IV SOLUTION) ×2 IMPLANT
PACK BASIN DAY SURGERY FS (CUSTOM PROCEDURE TRAY) ×2 IMPLANT
PAD ALCOHOL SWAB (MISCELLANEOUS) IMPLANT
PAD FOAM SILICONE BACKED (GAUZE/BANDAGES/DRESSINGS) IMPLANT
PENCIL SMOKE EVACUATOR (MISCELLANEOUS) ×2 IMPLANT
SLEEVE SCD COMPRESS KNEE MED (MISCELLANEOUS) ×2 IMPLANT
SPONGE LAP 18X18 RF (DISPOSABLE) ×4 IMPLANT
STRIP SUTURE WOUND CLOSURE 1/2 (MISCELLANEOUS) ×4 IMPLANT
SUT MNCRL AB 4-0 PS2 18 (SUTURE) ×9 IMPLANT
SUT MON AB 3-0 SH 27 (SUTURE) ×10
SUT MON AB 3-0 SH27 (SUTURE) ×1 IMPLANT
SUT MON AB 5-0 PS2 18 (SUTURE) ×5 IMPLANT
SUT PDS 3-0 CT2 (SUTURE)
SUT PDS AB 2-0 CT2 27 (SUTURE) IMPLANT
SUT PDS II 3-0 CT2 27 ABS (SUTURE) IMPLANT
SUT SILK 3 0 PS 1 (SUTURE) IMPLANT
SUT VIC AB 3-0 SH 27 (SUTURE)
SUT VIC AB 3-0 SH 27X BRD (SUTURE) IMPLANT
SUT VICRYL 4-0 PS2 18IN ABS (SUTURE) IMPLANT
SYR 3ML 23GX1 SAFETY (SYRINGE) ×2 IMPLANT
SYR 50ML LL SCALE MARK (SYRINGE) IMPLANT
SYR BULB IRRIG 60ML STRL (SYRINGE) ×2 IMPLANT
SYR CONTROL 10ML LL (SYRINGE) ×2 IMPLANT
TAPE MEASURE VINYL STERILE (MISCELLANEOUS) IMPLANT
TOWEL GREEN STERILE FF (TOWEL DISPOSABLE) ×4 IMPLANT
TRAY DSU PREP LF (CUSTOM PROCEDURE TRAY) ×2 IMPLANT
TUBE CONNECTING 20X1/4 (TUBING) ×2 IMPLANT
TUBING INFILTRATION IT-10001 (TUBING) IMPLANT
TUBING SET GRADUATE ASPIR 12FT (MISCELLANEOUS) IMPLANT
UNDERPAD 30X36 HEAVY ABSORB (UNDERPADS AND DIAPERS) ×4 IMPLANT
YANKAUER SUCT BULB TIP NO VENT (SUCTIONS) ×2 IMPLANT

## 2020-05-31 NOTE — Anesthesia Postprocedure Evaluation (Signed)
Anesthesia Post Note  Patient: Eileen Larsen  Procedure(s) Performed: BREAST REDUCTION WITH LIPOSUCTION (Bilateral Breast)     Patient location during evaluation: PACU Anesthesia Type: General Level of consciousness: sedated and patient cooperative Pain management: pain level controlled Vital Signs Assessment: post-procedure vital signs reviewed and stable Respiratory status: spontaneous breathing Cardiovascular status: stable Anesthetic complications: no   No complications documented.  Last Vitals:  Vitals:   05/31/20 1141 05/31/20 1143  BP: (!) 78/39 (!) 82/42  Pulse:    Resp:    Temp:    SpO2: 100% 100%    Last Pain:  Vitals:   05/31/20 1116  TempSrc: Oral  PainSc: 0-No pain                 Nolon Nations

## 2020-05-31 NOTE — Progress Notes (Signed)
While starting IV in left hand, patient reported she vagals sometimes and then became faint with a blood pressure of 78/39.  Gave patient ammonia inhalation and a cool cloth.  Patient lying back in recliner but started to improve immediately with a blood pressure now of 105/60.  Alert and oriented.

## 2020-05-31 NOTE — Discharge Instructions (Addendum)
INSTRUCTIONS FOR AFTER SURGERY   You will likely have some questions about what to expect following your operation.  The following information will help you and your family understand what to expect when you are discharged from the hospital.  Following these guidelines will help ensure a smooth recovery and reduce risks of complications.  Postoperative instructions include information on: diet, wound care, medications and physical activity.  AFTER SURGERY Expect to go home after the procedure.  In some cases, you may need to spend one night in the hospital for observation.  DIET This surgery does not require a specific diet.  However, I have to mention that the healthier you eat the better your body can start healing. It is important to increasing your protein intake.  This means limiting the foods with added sugar.  Focus on fruits and vegetables and some meat. It is very important to drink water after your surgery.  If your urine is bright yellow, then it is concentrated, and you need to drink more water.  As a general rule after surgery, you should have 8 ounces of water every hour while awake.  If you find you are persistently nauseated or unable to take in liquids let us know.  NO TOBACCO USE or EXPOSURE.  This will slow your healing process and increase the risk of a wound.  WOUND CARE If you don't have a drain: You can shower the day after surgery.  Use fragrance free soap.  Dial, [place], [place] and Cetaphil are usually mild on the skin.    If you have steri-strips / tape directly attached to your skin leave them in place. It is OK to get these wet.  No baths, pools or hot tubs for two weeks. We close your incision to leave the smallest and best-looking scar. No ointment or creams on your incisions until given the go ahead.  Especially not Neosporin (Too many skin reactions with this one).  A few weeks after surgery you can use Mederma and start massaging the scar. We ask you to wear your binder or  sports bra for the first 6 weeks around the clock, including while sleeping. This provides added comfort and helps reduce the fluid accumulation at the surgery site.  ACTIVITY No heavy lifting until cleared by the doctor.  It is OK to walk and climb stairs. In fact, moving your legs is very important to decrease your risk of a blood clot.  It will also help keep you from getting deconditioned.  Every 1 to 2 hours get up and walk for 5 minutes. This will help with a quicker recovery back to normal.  Let pain be your guide so you don't do too much.  NO, you cannot do the spring cleaning and don't plan on taking care of anyone else.  This is your time for TLC.   WORK Everyone returns to work at different times. As a rough guide, most people take at least 1 - 2 weeks off prior to returning to work. If you need documentation for your job, bring the forms to your postoperative follow up visit.  DRIVING Arrange for someone to bring you home from the hospital.  You may be able to drive a few days after surgery but not while taking any narcotics or valium.  BOWEL MOVEMENTS Constipation can occur after anesthesia and while taking pain medication.  It is important to stay ahead for your comfort.  We recommend taking Milk of Magnesia (2 tablespoons; twice a day)  while taking the pain pills.  SEROMA This is fluid your body tried to put in the surgical site.  This is normal but if it creates excessive pain and swelling let us know.  It usually decreases in a few weeks.  MEDICATIONS and PAIN CONTROL At your preoperative visit for you history and physical you were given the following medications: 1. An antibiotic: Start this medication when you get home and take according to the instructions on the bottle. 2. Zofran 4 mg:  This is to treat nausea and vomiting.  You can take this every 6 hours as needed and only if needed. 3. Norco (hydrocodone/acetaminophen) 5/325 mg:  This is only to be used after you have  taken the motrin or the tylenol. Every 8 hours as needed. Over the counter Medication to take: 4. Ibuprofen (Motrin) 600 mg:  Take this every 6 hours.  If you have additional pain then take 500 mg of the tylenol.  Only take the Norco after you have tried these two. 5. Miralax or stool softener of choice: Take this according to the bottle if you take the [place] Call your surgeon's office if any of the following occur: . Fever 101 degrees F or greater . Excessive bleeding or fluid from the incision site. . Pain that increases over time without aid from the medications . Redness, warmth, or pus draining from incision sites . Persistent nausea or inability to take in liquids . Severe misshapen area that underwent the operation.   NO TYLENOL PRODUCTS UNTIL 10:00 PM    Post Anesthesia Home Care Instructions  Activity: Get plenty of rest for the remainder of the day. A responsible individual must stay with you for 24 hours following the procedure.  For the next 24 hours, DO NOT: -Drive a car -Paediatric nurse -Drink alcoholic beverages -Take any medication unless instructed by your physician -Make any legal decisions or sign important papers.  Meals: Start with liquid foods such as gelatin or soup. Progress to regular foods as tolerated. Avoid greasy, spicy, heavy foods. If nausea and/or vomiting occur, drink only clear liquids until the nausea and/or vomiting subsides. Call your physician if vomiting continues.  Special Instructions/Symptoms: Your throat may feel dry or sore from the anesthesia or the breathing tube placed in your throat during surgery. If this causes discomfort, gargle with warm salt water. The discomfort should disappear within 24 hours.  If you had a scopolamine patch placed behind your ear for the management of post- operative nausea and/or vomiting:  1. The medication in the patch is effective for 72 hours, after which it should be removed.  Wrap  patch in a tissue and discard in the trash. Wash hands thoroughly with soap and water. 2. You may remove the patch earlier than 72 hours if you experience unpleasant side effects which may include dry mouth, dizziness or visual disturbances. 3. Avoid touching the patch. Wash your hands with soap and water after contact with the patch.

## 2020-05-31 NOTE — Op Note (Signed)
Breast Reduction Op note:    DATE OF PROCEDURE: 05/31/2020  LOCATION: [place]  SURGEON: Lyndee Leo Sanger Marae Cottrell, DO  ASSISTANT: Phoebe Sharps, PA  PREOPERATIVE DIAGNOSIS 1. Macromastia 2. Neck Pain 3. Back Pain  POSTOPERATIVE DIAGNOSIS 1. Macromastia 2. Neck Pain 3. Back Pain  PROCEDURES 1. Bilateral breast reduction.  Right reduction 492 g, Left reduction 154 g  COMPLICATIONS: None.  DRAINS: none  INDICATIONS FOR PROCEDURE Eileen Larsen is a 49 y.o. year-old female born on 1970-12-07,with a history of symptomatic macromastia with concominant back pain, neck pain, shoulder grooving from her bra.   MRN: [id number]  CONSENT Informed consent was obtained directly from the patient. The risks, benefits and alternatives were fully discussed. Specific risks including but not limited to bleeding, infection, hematoma, seroma, scarring, pain, nipple necrosis, asymmetry, poor cosmetic results, and need for further surgery were discussed. The patient had ample opportunity to have her questions answered to her satisfaction.  DESCRIPTION OF PROCEDURE  Patient was brought into the operating room and placed in a supine position.  SCDs were placed and appropriate padding was performed.  Antibiotics were given. The patient underwent general anesthesia and the chest was prepped and draped in a sterile fashion.  A timeout was performed and all information was confirmed to be correct.  Right side: Preoperative markings were confirmed.  Incision lines were injected with 1% Xylocaine with epinephrine.  After waiting for vasoconstriction, the marked lines were incised.  A Wise-pattern superomedial breast reduction was performed by de-epithelializing the pedicle, using bovie to create the superomedial pedicle, and removing breast tissue from the lateral and inferior portions of the breast.  Care was taken to not undermine the breast pedicle. Hemostasis was achieved.  The  nipple was gently rotated into position and the soft tissue closed with 4-0 Monocryl.   The pocket was irrigated and hemostasis confirmed.  The deep tissues were approximated with 3-0 Monocryl sutures.  After completing the left side I returned to take more out of the right for improved symmetry.  The areola was closed with the 4-0 Monocryl sutures.  The nipple and skin flaps had good capillary refill at the end of the procedure.    Left side: Preoperative markings were confirmed.  Incision lines were injected with 1% Xylocaine with epinephrine.  After waiting for vasoconstriction, the marked lines were incised.  A Wise-pattern superomedial breast reduction was performed by de-epithelializing the pedicle, using bovie to create the superomedial pedicle, and removing breast tissue from the lateral and inferior portions of the breast.  Care was taken to not undermine the breast pedicle. Hemostasis was achieved.  The nipple was gently rotated into position and the soft tissue was closed with 4-0 Monocryl.  The patient was sat upright and size and shape symmetry was confirmed.  The pocket was irrigated and hemostasis confirmed.  The deep tissues were approximated with 3-0 Monocryl sutures and the skin was closed with deep dermal and subcuticular 4-0 Monocryl sutures.  Dermabond was applied.  A breast binder and ABDs were placed.  The nipple and skin flaps had good capillary refill at the end of the procedure.  The patient tolerated the procedure well. The patient was allowed to wake from anesthesia and taken to the recovery room in satisfactory condition.  The advanced practice practitioner (APP) assisted throughout the case.  The APP was essential in retraction and counter traction when needed to make the case progress smoothly.  This retraction and assistance made it possible to  see the tissue plans for the procedure.  The assistance was needed for blood control, tissue re-approximation and assisted with closure of  the incision site.

## 2020-05-31 NOTE — Anesthesia Preprocedure Evaluation (Addendum)
Anesthesia Evaluation  Patient identified by MRN, date of birth, ID band Patient awake    Reviewed: Allergy & Precautions, NPO status , Patient's Chart, lab work & pertinent test results  Airway Mallampati: II  TM Distance: >3 FB Neck ROM: Full    Dental no notable dental hx.    Pulmonary neg pulmonary ROS,    Pulmonary exam normal breath sounds clear to auscultation       Cardiovascular hypertension, Pt. on medications negative cardio ROS Normal cardiovascular exam Rhythm:Regular Rate:Normal     Neuro/Psych negative neurological ROS  negative psych ROS   GI/Hepatic negative GI ROS, Neg liver ROS,   Endo/Other  negative endocrine ROSdiabetes  Renal/GU negative Renal ROS  negative genitourinary   Musculoskeletal negative musculoskeletal ROS (+)   Abdominal   Peds negative pediatric ROS (+)  Hematology negative hematology ROS (+)   Anesthesia Other Findings   Reproductive/Obstetrics negative OB ROS                             Anesthesia Physical Anesthesia Plan  ASA: III  Anesthesia Plan: General   Post-op Pain Management:    Induction: Intravenous  PONV Risk Score and Plan: 3 and Ondansetron, Dexamethasone, Midazolam and Treatment may vary due to age or medical condition  Airway Management Planned: LMA  Additional Equipment:   Intra-op Plan:   Post-operative Plan: Extubation in OR  Informed Consent: I have reviewed the patients History and Physical, chart, labs and discussed the procedure including the risks, benefits and alternatives for the proposed anesthesia with the patient or authorized representative who has indicated his/her understanding and acceptance.     Dental advisory given  Plan Discussed with: CRNA  Anesthesia Plan Comments:         Anesthesia Quick Evaluation

## 2020-05-31 NOTE — Transfer of Care (Signed)
Immediate Anesthesia Transfer of Care Note  Patient: Eileen Larsen  Procedure(s) Performed: BREAST REDUCTION WITH LIPOSUCTION (Bilateral Breast)  Patient Location: PACU  Anesthesia Type:General  Level of Consciousness: sedated  Airway & Oxygen Therapy: Patient Spontanous Breathing and Patient connected to face mask oxygen  Post-op Assessment: Report given to RN and Post -op Vital signs reviewed and stable  Post vital signs: Reviewed and stable  Last Vitals:  Vitals Value Taken Time  BP 128/66 05/31/20 1527  Temp    Pulse 73 05/31/20 1530  Resp 20 05/31/20 1530  SpO2 100 % 05/31/20 1530  Vitals shown include unvalidated device data.  Last Pain:  Vitals:   05/31/20 1116  TempSrc: Oral  PainSc: 0-No pain      Patients Stated Pain Goal: 3 (69/43/70 0525)  Complications: No complications documented.

## 2020-05-31 NOTE — Interval H&P Note (Signed)
History and Physical Interval Note:  05/31/2020 12:11 PM  Eileen Larsen  has presented today for surgery, with the diagnosis of mammary hypertrophy.  The various methods of treatment have been discussed with the patient and family. After consideration of risks, benefits and other options for treatment, the patient has consented to  Procedure(s): BREAST REDUCTION WITH LIPOSUCTION (Bilateral) as a surgical intervention.  The patient's history has been reviewed, patient examined, no change in status, stable for surgery.  I have reviewed the patient's chart and labs.  Questions were answered to the patient's satisfaction.     Loel Lofty Rasha Ibe

## 2020-05-31 NOTE — Anesthesia Procedure Notes (Signed)
Procedure Name: LMA Insertion Date/Time: 05/31/2020 12:50 PM Performed by: Glory Buff, CRNA Pre-anesthesia Checklist: Patient identified, Emergency Drugs available, Suction available and Patient being monitored Patient Re-evaluated:Patient Re-evaluated prior to induction Oxygen Delivery Method: Circle system utilized Preoxygenation: Pre-oxygenation with 100% oxygen LMA: LMA inserted LMA Size: 4.0 Number of attempts: 1 Placement Confirmation: positive ETCO2 Tube secured with: Tape Dental Injury: Teeth and Oropharynx as per pre-operative assessment

## 2020-06-03 ENCOUNTER — Encounter (INDEPENDENT_AMBULATORY_CARE_PROVIDER_SITE_OTHER): Payer: Self-pay

## 2020-06-04 ENCOUNTER — Encounter (HOSPITAL_BASED_OUTPATIENT_CLINIC_OR_DEPARTMENT_OTHER): Payer: Self-pay | Admitting: Plastic Surgery

## 2020-06-04 ENCOUNTER — Telehealth: Payer: Self-pay

## 2020-06-04 LAB — SURGICAL PATHOLOGY

## 2020-06-04 NOTE — Telephone Encounter (Signed)
Patient called with questions regarding post op instructions. She would like to know she can remove the gauze that was applied after her breast reduction surgery on 05/31/2020. Can she also wear a front zip sports bra now? Lastly, she has noted her nipple looks misshapen and would like to know if this is normal.

## 2020-06-04 NOTE — Telephone Encounter (Signed)
Returned patients call. Advised her to stay in her binder or Ace wrap 24/7 from surgery. She may change the gauze on top of her nipple and replace with a clean one, but  do not remove anything else including steri strips. She has a follow up appointment with [facility] 06/08/20. He will advise her if she is ready to move to a front zipper sports bra when she comes Friday.Patient understood and agreed.

## 2020-06-05 ENCOUNTER — Other Ambulatory Visit: Payer: Self-pay

## 2020-06-05 ENCOUNTER — Ambulatory Visit (INDEPENDENT_AMBULATORY_CARE_PROVIDER_SITE_OTHER): Payer: BC Managed Care – PPO | Admitting: Plastic Surgery

## 2020-06-05 ENCOUNTER — Encounter: Payer: Self-pay | Admitting: Plastic Surgery

## 2020-06-05 DIAGNOSIS — N62 Hypertrophy of breast: Secondary | ICD-10-CM

## 2020-06-05 NOTE — Progress Notes (Signed)
The patient is a 49 year old female here for follow-up on her breast reduction.  She called because she was concerned about a possible opening up of the right areola incision.  I had her come in to take a look.  It actually looks really good and there is nothing of concern she may have a little bit of a seroma on the left but we will give it some time and see her on Friday.  The patient was happy with that decision.

## 2020-06-08 ENCOUNTER — Other Ambulatory Visit: Payer: Self-pay

## 2020-06-08 ENCOUNTER — Encounter: Payer: BC Managed Care – PPO | Admitting: Plastic Surgery

## 2020-06-08 ENCOUNTER — Ambulatory Visit (INDEPENDENT_AMBULATORY_CARE_PROVIDER_SITE_OTHER): Payer: BC Managed Care – PPO | Admitting: Surgical

## 2020-06-08 ENCOUNTER — Encounter: Payer: Self-pay | Admitting: Surgical

## 2020-06-08 VITALS — BP 125/86 | HR 86 | Temp 98.2°F

## 2020-06-08 DIAGNOSIS — Z719 Counseling, unspecified: Secondary | ICD-10-CM

## 2020-06-08 DIAGNOSIS — N62 Hypertrophy of breast: Secondary | ICD-10-CM

## 2020-06-08 DIAGNOSIS — Z9889 Other specified postprocedural states: Secondary | ICD-10-CM

## 2020-06-08 NOTE — Progress Notes (Signed)
Patient is a 49 year old female here for follow-up after bilateral breast reduction with Dr. Marla Roe on 05/31/2020.  She is overall doing really well, reports that she has noticed some increase swelling in her right breast.  She has been wearing this breast binder.  She reports that she fell this morning while walking into her house.  Chaperone present on exam On exam bilateral NAC's are viable with good color, incisions intact throughout.  Honeycomb dressings in place.  No incisional dehiscence noted.  Right breast is slightly larger, no obvious fluid collection noted with palpation.  Discussed with patient she can begin wearing sports bra for compression, discussed with patient that at this time she may have a little bit of fluid collecting on the right side but I do not feel as if there is enough to warrant needle aspiration.  We will continue to monitor this and I discussed with her to call with any questions or concerns.  She does have a follow-up in approximately 2 weeks for reevaluation.  Avoid strenuous activity. There is no sign of any infection.

## 2020-06-22 ENCOUNTER — Encounter: Payer: Self-pay | Admitting: Plastic Surgery

## 2020-06-22 ENCOUNTER — Ambulatory Visit (INDEPENDENT_AMBULATORY_CARE_PROVIDER_SITE_OTHER): Payer: BC Managed Care – PPO | Admitting: Plastic Surgery

## 2020-06-22 ENCOUNTER — Other Ambulatory Visit: Payer: Self-pay

## 2020-06-22 VITALS — HR 86 | Temp 99.1°F

## 2020-06-22 DIAGNOSIS — N62 Hypertrophy of breast: Secondary | ICD-10-CM

## 2020-06-22 NOTE — Progress Notes (Signed)
The patient is a 49 year old female here for follow-up after breast reduction.  There is no sign of seroma or hematoma.  The patient is very pleased with her size.  I would like her to keep the Steri-Strips in himself in the shower.  We will plan to remove them in 2-3 weeks at her follow up appointment. She is in agreement.  Pictures were obtained of the patient and placed in the chart with the patient's or guardian's permission.

## 2020-07-20 ENCOUNTER — Other Ambulatory Visit: Payer: Self-pay

## 2020-07-20 ENCOUNTER — Ambulatory Visit (INDEPENDENT_AMBULATORY_CARE_PROVIDER_SITE_OTHER): Payer: BC Managed Care – PPO | Admitting: Surgical

## 2020-07-20 ENCOUNTER — Encounter: Payer: Self-pay | Admitting: Surgical

## 2020-07-20 VITALS — BP 127/81 | HR 89 | Temp 98.6°F

## 2020-07-20 DIAGNOSIS — N62 Hypertrophy of breast: Secondary | ICD-10-CM

## 2020-07-20 DIAGNOSIS — Z9889 Other specified postprocedural states: Secondary | ICD-10-CM

## 2020-07-20 DIAGNOSIS — Z719 Counseling, unspecified: Secondary | ICD-10-CM

## 2020-07-20 NOTE — Progress Notes (Signed)
Patient is a 49 year old female here for follow-up after breast reduction with Dr. Marla Roe on 05/31/2020.  She is doing really well.  She has done some Pilates classes and feels like those have been going great.  She did not have any pain with them.  She is very happy with the improvement in her neck and back pain and the size of her breast.  She has been using skinuva scar cream.  Chaperone present on exam On exam bilateral breast incisions intact, incisions are healing nicely.  There is no erythema or swelling noted.  Bilateral NAC's are viable with good color.  No fluid noted.  A little bit of scarring noted with palpation of the left vertical limb.  Recommend continuing to wear sports bra throughout the day, no longer need to wear at night.  No restrictions on activity.  Recommend avoiding normal bra for a few more months.  Recommend calling with questions or concerns, recommend following up in 4 months.  Discussed with patient that if she is doing well in 4 months she can cancel that appointment.  Pictures were obtained of the patient and placed in the chart with the patient's or guardian's permission.

## 2020-07-23 DIAGNOSIS — E785 Hyperlipidemia, unspecified: Secondary | ICD-10-CM | POA: Diagnosis not present

## 2020-07-23 DIAGNOSIS — Z0000 Encounter for general adult medical examination without abnormal findings: Secondary | ICD-10-CM | POA: Diagnosis not present

## 2020-07-23 DIAGNOSIS — E119 Type 2 diabetes mellitus without complications: Secondary | ICD-10-CM | POA: Diagnosis not present

## 2020-07-23 DIAGNOSIS — Z23 Encounter for immunization: Secondary | ICD-10-CM | POA: Diagnosis not present

## 2020-09-24 DIAGNOSIS — Z03818 Encounter for observation for suspected exposure to other biological agents ruled out: Secondary | ICD-10-CM | POA: Diagnosis not present

## 2020-09-24 DIAGNOSIS — Z20822 Contact with and (suspected) exposure to covid-19: Secondary | ICD-10-CM | POA: Diagnosis not present

## 2020-09-27 DIAGNOSIS — Z20822 Contact with and (suspected) exposure to covid-19: Secondary | ICD-10-CM | POA: Diagnosis not present

## 2020-10-05 DIAGNOSIS — H2513 Age-related nuclear cataract, bilateral: Secondary | ICD-10-CM | POA: Diagnosis not present

## 2020-10-05 DIAGNOSIS — H35033 Hypertensive retinopathy, bilateral: Secondary | ICD-10-CM | POA: Diagnosis not present

## 2020-10-05 DIAGNOSIS — E119 Type 2 diabetes mellitus without complications: Secondary | ICD-10-CM | POA: Diagnosis not present

## 2020-10-10 DIAGNOSIS — H00022 Hordeolum internum right lower eyelid: Secondary | ICD-10-CM | POA: Diagnosis not present

## 2020-11-20 ENCOUNTER — Ambulatory Visit: Payer: BC Managed Care – PPO | Admitting: Surgical

## 2020-11-20 ENCOUNTER — Other Ambulatory Visit: Payer: Self-pay

## 2020-11-20 ENCOUNTER — Encounter: Payer: Self-pay | Admitting: Surgical

## 2020-11-20 DIAGNOSIS — M542 Cervicalgia: Secondary | ICD-10-CM | POA: Diagnosis not present

## 2020-11-20 DIAGNOSIS — N62 Hypertrophy of breast: Secondary | ICD-10-CM

## 2020-11-20 DIAGNOSIS — Z9889 Other specified postprocedural states: Secondary | ICD-10-CM

## 2020-11-20 NOTE — Progress Notes (Signed)
Patient is a 50 year old female here for follow-up after bilateral breast reduction with Dr. Marla Roe on 05/31/2020.  She is approximately 6 months postop.  She reports that she is doing really well, she reports that she is very happy with the improvement in her neck and back pain and feels more comfortable exercising.  She reports that prior to her surgery she would occasionally break out in a scaly/dry skin rash around her nipple areola.  She reports that this recently occurred on her left nipple areola and has been there for approximately 1 to 2 weeks.  She reports that it typically resolves on its own.  She reports that she has never had this treated by a healthcare provider.  Chaperone present on exam On exam bilateral breast incisions intact, bilateral NAC's are viable, bilateral incisions are healing very nicely.  Bilateral breasts are symmetric.  No erythema noted.  No swelling noted with palpation.  She does have a scaly rash along the border of the left NAC from approximately 10-12 o'clock.  There is no surrounding erythema.  Recommend moisturizing lotion, Vaseline/Aquaphor or Eucerin cream to scaly dry skin daily.  I do recommend that if this does not resolve in the next few weeks she have it evaluated by her PCP.  It does not appear to be infectious in etiology or concerning.  There is no sign of infection, seroma, hematoma.  She has no restrictions at this time.  We discussed following up on an as-needed basis.  I recommend she call with any questions or concerns or if her symptoms change or she has any changes that concern her.

## 2021-02-12 DIAGNOSIS — L309 Dermatitis, unspecified: Secondary | ICD-10-CM | POA: Diagnosis not present

## 2021-02-12 DIAGNOSIS — I10 Essential (primary) hypertension: Secondary | ICD-10-CM | POA: Diagnosis not present

## 2021-02-12 DIAGNOSIS — E785 Hyperlipidemia, unspecified: Secondary | ICD-10-CM | POA: Diagnosis not present

## 2021-02-12 DIAGNOSIS — E119 Type 2 diabetes mellitus without complications: Secondary | ICD-10-CM | POA: Diagnosis not present

## 2021-02-12 DIAGNOSIS — D649 Anemia, unspecified: Secondary | ICD-10-CM | POA: Diagnosis not present

## 2021-02-26 DIAGNOSIS — Z01419 Encounter for gynecological examination (general) (routine) without abnormal findings: Secondary | ICD-10-CM | POA: Diagnosis not present

## 2021-02-26 DIAGNOSIS — E7800 Pure hypercholesterolemia, unspecified: Secondary | ICD-10-CM | POA: Diagnosis not present

## 2021-02-26 DIAGNOSIS — E119 Type 2 diabetes mellitus without complications: Secondary | ICD-10-CM | POA: Diagnosis not present

## 2021-02-26 DIAGNOSIS — I10 Essential (primary) hypertension: Secondary | ICD-10-CM | POA: Diagnosis not present

## 2021-07-26 DIAGNOSIS — E785 Hyperlipidemia, unspecified: Secondary | ICD-10-CM | POA: Diagnosis not present

## 2021-07-26 DIAGNOSIS — D649 Anemia, unspecified: Secondary | ICD-10-CM | POA: Diagnosis not present

## 2021-07-26 DIAGNOSIS — E1169 Type 2 diabetes mellitus with other specified complication: Secondary | ICD-10-CM | POA: Diagnosis not present

## 2021-07-26 DIAGNOSIS — Z0000 Encounter for general adult medical examination without abnormal findings: Secondary | ICD-10-CM | POA: Diagnosis not present

## 2021-07-31 DIAGNOSIS — U071 COVID-19: Secondary | ICD-10-CM | POA: Diagnosis not present

## 2021-07-31 DIAGNOSIS — Z0000 Encounter for general adult medical examination without abnormal findings: Secondary | ICD-10-CM | POA: Diagnosis not present

## 2021-07-31 DIAGNOSIS — I10 Essential (primary) hypertension: Secondary | ICD-10-CM | POA: Diagnosis not present

## 2021-07-31 DIAGNOSIS — E1169 Type 2 diabetes mellitus with other specified complication: Secondary | ICD-10-CM | POA: Diagnosis not present

## 2021-07-31 DIAGNOSIS — R051 Acute cough: Secondary | ICD-10-CM | POA: Diagnosis not present

## 2021-07-31 DIAGNOSIS — E785 Hyperlipidemia, unspecified: Secondary | ICD-10-CM | POA: Diagnosis not present

## 2021-08-15 ENCOUNTER — Other Ambulatory Visit (HOSPITAL_COMMUNITY): Payer: Self-pay | Admitting: Family Medicine

## 2021-09-02 ENCOUNTER — Ambulatory Visit (HOSPITAL_COMMUNITY)
Admission: RE | Admit: 2021-09-02 | Discharge: 2021-09-02 | Disposition: A | Discharge status: Home / Self Care | Payer: BC Managed Care – PPO | Source: Ambulatory Visit | Attending: Family Medicine | Admitting: Family Medicine

## 2021-09-02 ENCOUNTER — Other Ambulatory Visit: Payer: Self-pay

## 2021-09-02 DIAGNOSIS — K760 Fatty (change of) liver, not elsewhere classified: Secondary | ICD-10-CM | POA: Insufficient documentation

## 2021-09-02 DIAGNOSIS — Z136 Encounter for screening for cardiovascular disorders: Secondary | ICD-10-CM | POA: Insufficient documentation

## 2021-09-16 DIAGNOSIS — L281 Prurigo nodularis: Secondary | ICD-10-CM | POA: Diagnosis not present

## 2021-09-16 DIAGNOSIS — Z789 Other specified health status: Secondary | ICD-10-CM | POA: Diagnosis not present

## 2021-10-16 DIAGNOSIS — H35033 Hypertensive retinopathy, bilateral: Secondary | ICD-10-CM | POA: Diagnosis not present

## 2021-10-16 DIAGNOSIS — H2513 Age-related nuclear cataract, bilateral: Secondary | ICD-10-CM | POA: Diagnosis not present

## 2021-10-16 DIAGNOSIS — E119 Type 2 diabetes mellitus without complications: Secondary | ICD-10-CM | POA: Diagnosis not present

## 2021-10-28 DIAGNOSIS — L280 Lichen simplex chronicus: Secondary | ICD-10-CM | POA: Diagnosis not present

## 2021-10-28 DIAGNOSIS — L281 Prurigo nodularis: Secondary | ICD-10-CM | POA: Diagnosis not present

## 2021-10-28 DIAGNOSIS — L2084 Intrinsic (allergic) eczema: Secondary | ICD-10-CM | POA: Diagnosis not present

## 2021-11-08 DIAGNOSIS — I10 Essential (primary) hypertension: Secondary | ICD-10-CM | POA: Diagnosis not present

## 2021-11-08 DIAGNOSIS — E785 Hyperlipidemia, unspecified: Secondary | ICD-10-CM | POA: Diagnosis not present

## 2021-11-08 DIAGNOSIS — E1169 Type 2 diabetes mellitus with other specified complication: Secondary | ICD-10-CM | POA: Diagnosis not present

## 2022-01-28 DIAGNOSIS — E1169 Type 2 diabetes mellitus with other specified complication: Secondary | ICD-10-CM | POA: Diagnosis not present

## 2022-01-28 DIAGNOSIS — I10 Essential (primary) hypertension: Secondary | ICD-10-CM | POA: Diagnosis not present

## 2022-01-28 DIAGNOSIS — E785 Hyperlipidemia, unspecified: Secondary | ICD-10-CM | POA: Diagnosis not present

## 2022-02-07 DIAGNOSIS — E785 Hyperlipidemia, unspecified: Secondary | ICD-10-CM | POA: Diagnosis not present

## 2022-02-07 DIAGNOSIS — E1169 Type 2 diabetes mellitus with other specified complication: Secondary | ICD-10-CM | POA: Diagnosis not present

## 2022-02-27 DIAGNOSIS — E119 Type 2 diabetes mellitus without complications: Secondary | ICD-10-CM | POA: Diagnosis not present

## 2022-02-27 DIAGNOSIS — I10 Essential (primary) hypertension: Secondary | ICD-10-CM | POA: Diagnosis not present

## 2022-02-27 DIAGNOSIS — Z01419 Encounter for gynecological examination (general) (routine) without abnormal findings: Secondary | ICD-10-CM | POA: Diagnosis not present

## 2022-02-27 DIAGNOSIS — D259 Leiomyoma of uterus, unspecified: Secondary | ICD-10-CM | POA: Diagnosis not present

## 2022-03-04 DIAGNOSIS — Z1231 Encounter for screening mammogram for malignant neoplasm of breast: Secondary | ICD-10-CM | POA: Diagnosis not present

## 2022-03-24 ENCOUNTER — Other Ambulatory Visit: Payer: Self-pay

## 2022-03-24 ENCOUNTER — Encounter (HOSPITAL_BASED_OUTPATIENT_CLINIC_OR_DEPARTMENT_OTHER): Payer: Self-pay | Admitting: Obstetrics and Gynecology

## 2022-03-24 ENCOUNTER — Emergency Department (HOSPITAL_BASED_OUTPATIENT_CLINIC_OR_DEPARTMENT_OTHER)
Admission: EM | Admit: 2022-03-24 | Discharge: 2022-03-24 | Disposition: A | Discharge status: Home / Self Care | Payer: BC Managed Care – PPO | Attending: Emergency Medicine | Admitting: Emergency Medicine

## 2022-03-24 DIAGNOSIS — S60411A Abrasion of left index finger, initial encounter: Secondary | ICD-10-CM | POA: Insufficient documentation

## 2022-03-24 DIAGNOSIS — W540XXA Bitten by dog, initial encounter: Secondary | ICD-10-CM | POA: Diagnosis not present

## 2022-03-24 DIAGNOSIS — S65501A Unspecified injury of blood vessel of left index finger, initial encounter: Secondary | ICD-10-CM | POA: Diagnosis not present

## 2022-03-24 DIAGNOSIS — S61251A Open bite of left index finger without damage to nail, initial encounter: Secondary | ICD-10-CM | POA: Diagnosis not present

## 2022-03-24 DIAGNOSIS — Z23 Encounter for immunization: Secondary | ICD-10-CM | POA: Diagnosis not present

## 2022-03-24 MED ORDER — AMOXICILLIN-POT CLAVULANATE 875-125 MG PO TABS: 1.0000 | ORAL_TABLET | Freq: Two times a day (BID) | ORAL | 0 refills | Status: AC

## 2022-03-24 MED ORDER — RABIES VACCINE, PCEC IM SUSR
1.0000 mL | Freq: Once | INTRAMUSCULAR | Status: AC
  Administered 2022-03-24: 1 mL via INTRAMUSCULAR
  Filled 2022-03-24: qty 1

## 2022-03-24 NOTE — ED Provider Notes (Signed)
[facility] EMERGENCY DEPT Provider Note   CSN: [id number] Arrival date & time: 03/24/22  1534     History  Chief Complaint  Patient presents with   Animal Bite    Eileen Larsen is a 51 y.o. female.  Patient presents ER chief complaint of a dog bite.  She states that she was walking in her neighborhood yesterday when she saw 2 people walking 3 dogs.  She was trying to pet the puppy, when the older dog who was also leached came forward and snapped at the patient's left hand index finger.  She did not notice blood initially.  Later on she thought she saw small abrasion/cut.  She washed her hand thoroughly at home yesterday.  However she does not know if the dog was vaccinated as the owners appear to quickly take their animals away.       Home Medications Prior to Admission medications   Medication Sig Start Date End Date Taking? Authorizing Provider  amoxicillin-clavulanate (AUGMENTIN) 875-125 MG tablet Take 1 tablet by mouth every 12 (twelve) hours for 7 days. 03/24/22 03/31/22 Yes Alvis Edgell, Greggory Brandy, MD  acetaminophen (TYLENOL) 500 MG tablet Take 1 tablet (500 mg total) by mouth every 6 (six) hours as needed. For use AFTER surgery 05/15/20   Phoebe Sharps C, PA-C  amLODipine (NORVASC) 5 MG tablet Take 5 mg by mouth daily.    [provider]  atorvastatin (LIPITOR) 20 MG tablet Take 20 mg by mouth daily.    [provider]  hydrochlorothiazide (HYDRODIURIL) 25 MG tablet Take 25 mg by mouth daily.    [provider]  ibuprofen (ADVIL) 600 MG tablet Take 1 tablet (600 mg total) by mouth every 6 (six) hours as needed for mild pain or moderate pain. For use AFTER surgery 05/15/20   Phoebe Sharps C, PA-C  ibuprofen (ADVIL,MOTRIN) 800 MG tablet Take 1 tablet (800 mg total) by mouth 3 (three) times daily. 07/10/17   Ascencion Dike, PA-C  metFORMIN (GLUCOPHAGE) 500 MG tablet Take 1,000 mg by mouth 2 (two) times daily with a meal.     [provider]      Allergies    Patient has no known allergies.    Review of Systems   Review of Systems  Constitutional:  Negative for fever.  HENT:  Negative for ear pain.   Eyes:  Negative for pain.  Respiratory:  Negative for cough.   Cardiovascular:  Negative for chest pain.  Gastrointestinal:  Negative for abdominal pain.  Genitourinary:  Negative for flank pain.  Musculoskeletal:  Negative for back pain.  Skin:  Negative for rash.  Neurological:  Negative for headaches.    Physical Exam Updated Vital Signs BP (!) 140/95   Pulse 97   Temp 99.7 F (37.6 C)   Resp 16   Ht '5\' 6"'$  (1.676 m)   Wt 79.4 kg   SpO2 100%   BMI 28.25 kg/m  Physical Exam Constitutional:      General: She is not in acute distress.    Appearance: Normal appearance.  HENT:     Head: Normocephalic.     Nose: Nose normal.  Eyes:     Extraocular Movements: Extraocular movements intact.  Cardiovascular:     Rate and Rhythm: Normal rate.  Pulmonary:     Effort: Pulmonary effort is normal.  Musculoskeletal:        General: Normal range of motion.     Cervical back: Normal range of motion.  Skin:    Comments: Left index finger superficial abrasion versus small laceration.  Not amenable to any suture repair.  Otherwise neurovascularly intact extremity.  Neurological:     General: No focal deficit present.     Mental Status: She is alert. Mental status is at baseline.     ED Results / Procedures / Treatments   Labs (all labs ordered are listed, but only abnormal results are displayed) Labs Reviewed - No data to display  EKG None  Radiology No results found.  Procedures Procedures    Medications Ordered in ED Medications  rabies vaccine (RABAVERT) injection 1 mL (has no administration in time range)    ED Course/ Medical Decision Making/ A&P                           Medical Decision Making Risk Prescription drug management.   Patient presents with superficial bite to the left  index finger.  No laceration repair needed.  Wound was already more than 24 hours old.  Will prescribe antibiotics.  Long discussion regarding rabies.  Patient elected to pursue rabies vaccination which was provided here in the ER.  Recommended her to finish the series.          Final Clinical Impression(s) / ED Diagnoses Final diagnoses:  Dog bite, initial encounter    Rx / DC Orders ED Discharge Orders          Ordered    amoxicillin-clavulanate (AUGMENTIN) 875-125 MG tablet  Every 12 hours        03/24/22 1725              Luna Fuse, MD 03/24/22 1727

## 2022-03-24 NOTE — ED Notes (Signed)
RN provided AVS using Teachback Method. Patient verbalizes understanding of Discharge Instructions. Opportunity for Questioning and Answers were provided by RN. Patient Discharged from ED ambulatory to Home via Self.  

## 2022-03-24 NOTE — Discharge Instructions (Signed)
You were given your first rabies vaccine today.  You will need a total of 4 vaccines.  The next 3 should be given on days 3, 7 and finally on day 14.  Return back to the ER for your shots as directed.  If you see redness fevers or worsening symptoms return immediately back to the ER.

## 2022-03-24 NOTE — ED Triage Notes (Signed)
Patient reports to the ER for a dog bite that occurred yesterday. Patient reports she was bitten by an unknown dog in her neighborhood. Patient reports she is unsure if dog is up to date on their rabies shots. Patient has a small area that broke the skin on the left pointer finger.

## 2022-03-31 ENCOUNTER — Encounter (HOSPITAL_COMMUNITY): Payer: Self-pay | Admitting: Emergency Medicine

## 2022-03-31 ENCOUNTER — Other Ambulatory Visit: Payer: Self-pay

## 2022-03-31 ENCOUNTER — Ambulatory Visit (HOSPITAL_COMMUNITY)
Admission: EM | Admit: 2022-03-31 | Discharge: 2022-03-31 | Disposition: A | Discharge status: Home / Self Care | Payer: BC Managed Care – PPO | Attending: Internal Medicine | Admitting: Internal Medicine

## 2022-03-31 DIAGNOSIS — J069 Acute upper respiratory infection, unspecified: Secondary | ICD-10-CM

## 2022-03-31 DIAGNOSIS — Z203 Contact with and (suspected) exposure to rabies: Secondary | ICD-10-CM

## 2022-03-31 MED ORDER — RABIES VACCINE, PCEC IM SUSR
1.0000 mL | Freq: Once | INTRAMUSCULAR | Status: AC
  Administered 2022-03-31: 1 mL via INTRAMUSCULAR

## 2022-03-31 MED ORDER — RABIES VACCINE, PCEC IM SUSR
INTRAMUSCULAR | Status: AC
  Filled 2022-03-31: qty 1

## 2022-03-31 NOTE — Discharge Instructions (Addendum)
Viral URI, recommend hydrating well with fluids.  Over-the-counter Mucinex DM can help with management of viral respiratory symptoms.  If nasal symptoms persist due to high content of pollen antihistamine such as Zyrtec and/or Xyzal are helpful in management of nasal symptoms of sneezing and nasal drainage.

## 2022-03-31 NOTE — ED Triage Notes (Signed)
Sneezing, runny nose since Friday.  Received 2nd rabies injection while in [place].  Requesting provider consult related to feeling unwell after 2nd rabies shot

## 2022-03-31 NOTE — ED Provider Notes (Signed)
[facility]    CSN: [id number] Arrival date & time: 03/31/22  1244      History   Chief Complaint Chief Complaint  Patient presents with  . Rabies Injection  . URI    HPI Eileen Larsen is a 51 y.o. female.   HPI Patient presents today for her 3 of 4 series rabies injection.  Patient was concerned about getting the third rabies vaccine as she has a current URI.  She is currently afebrile.  Symptoms have been present for the last 3 days.  She is recently traveled out of town.  She complains of nasal congestion, nasal drainage, initially a scratchy throat which is resolved, generalized fatigue, and a cough.  No worrisome symptoms of wheezing, shortness of breath.  She has not taken any medication for symptoms.. Past Medical History:  Diagnosis Date  . Anemia   . Diabetes mellitus without complication ([place])   . Fibroids   . Hyperlipidemia   . Hypertension   . Kidney stone     Patient Active Problem List   Diagnosis Date Noted  . Back pain 10/25/2019  . Neck pain 10/25/2019  . 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 Fibroid 07/10/2017  . Menorrhagia, premenopausal 07/09/2017    Past Surgical History:  Procedure Laterality Date  . BREAST REDUCTION SURGERY Bilateral 05/31/2020   Procedure: BREAST REDUCTION WITH LIPOSUCTION;  Surgeon: Wallace Going, DO;  Location: [facility];  Service: Plastics;  Laterality: Bilateral;  . IR ANGIOGRAM PELVIS SELECTIVE OR SUPRASELECTIVE  07/09/2017  . IR ANGIOGRAM PELVIS SELECTIVE OR SUPRASELECTIVE  07/09/2017  . IR ANGIOGRAM SELECTIVE EACH ADDITIONAL VESSEL  07/09/2017  . IR ANGIOGRAM SELECTIVE EACH ADDITIONAL VESSEL  07/09/2017  . IR EMBO TUMOR ORGAN ISCHEMIA INFARCT INC GUIDE ROADMAPPING  07/09/2017  . IR RADIOLOGIST EVAL & MGMT  05/06/2017  . IR RADIOLOGIST EVAL & MGMT  08/05/2017  . IR RADIOLOGIST EVAL & MGMT  12/31/2017  . IR US GUIDE VASC ACCESS RIGHT  07/09/2017  . LITHOTRIPSY      OB  History     Gravida  2   Para      Term      Preterm      AB      Living  2      SAB      IAB      Ectopic      Multiple      Live Births  2            Home Medications    Prior to Admission medications   Medication Sig Start Date End Date Taking? Authorizing Provider  acetaminophen (TYLENOL) 500 MG tablet Take 1 tablet (500 mg total) by mouth every 6 (six) hours as needed. For use AFTER surgery 05/15/20   Phoebe Sharps C, PA-C  amLODipine (NORVASC) 5 MG tablet Take 5 mg by mouth daily.    [provider]  amoxicillin-clavulanate (AUGMENTIN) 875-125 MG tablet Take 1 tablet by mouth every 12 (twelve) hours for 7 days. 03/24/22 03/31/22  Luna Fuse, MD  atorvastatin (LIPITOR) 20 MG tablet Take 20 mg by mouth daily.    [provider]  hydrochlorothiazide (HYDRODIURIL) 25 MG tablet Take 25 mg by mouth daily.    [provider]  ibuprofen (ADVIL) 600 MG tablet Take 1 tablet (600 mg total) by mouth every 6 (six) hours as needed for mild pain or moderate pain. For use AFTER surgery 05/15/20   Annamaria Boots,  Johanna C, PA-C  ibuprofen (ADVIL,MOTRIN) 800 MG tablet Take 1 tablet (800 mg total) by mouth 3 (three) times daily. 07/10/17   Ascencion Dike, PA-C  metFORMIN (GLUCOPHAGE) 500 MG tablet Take 1,000 mg by mouth 2 (two) times daily with a meal.     [provider]    Family History History reviewed. No pertinent family history.  Social History Social History   Tobacco Use  . Smoking status: Never  . Smokeless tobacco: Never  Vaping Use  . Vaping Use: Never used  Substance Use Topics  . Alcohol use: Yes    Comment: rare  . Drug use: No     Allergies   Patient has no known allergies.   Review of Systems Review of Systems Pertinent negatives listed in HPI   Physical Exam Triage Vital Signs ED Triage Vitals [03/31/22 1445]  Enc Vitals Group     BP      Pulse      Resp      Temp      Temp src      SpO2      Weight       Height      Head Circumference      Peak Flow      Pain Score 0     Pain Loc      Pain Edu?      Excl. in [place]?    No data found.  Updated Vital Signs BP 129/87 (BP Location: Left Arm)   Pulse 100   Temp 98.4 F (36.9 C) (Oral)   Resp 18   SpO2 98%   Visual Acuity Right Eye Distance:   Left Eye Distance:   Bilateral Distance:    Right Eye Near:   Left Eye Near:    Bilateral Near:     Physical Exam   UC Treatments / Results  Labs (all labs ordered are listed, but only abnormal results are displayed) Labs Reviewed - No data to display  EKG   Radiology No results found.  Procedures Procedures (including critical care time)  Medications Ordered in UC Medications  rabies vaccine (RABAVERT) injection 1 mL (has no administration in time range)    Initial Impression / Assessment and Plan / UC Course  I have reviewed the triage vital signs and the nursing notes.  Pertinent labs & imaging results that were available during my care of the patient were reviewed by me and considered in my medical decision making (see chart for details).     Final Clinical Impressions(s) / UC Diagnoses   Final diagnoses:  Contact with or exposure to rabies     Discharge Instructions      Viral URI, recoome     ED Prescriptions   None    PDMP not reviewed this encounter.

## 2022-03-31 NOTE — ED Triage Notes (Signed)
Initial visit to ED post dog bite reported as 03/24/2022.  Patient is here today for rabies injection #3.

## 2022-04-07 ENCOUNTER — Ambulatory Visit (HOSPITAL_COMMUNITY)
Admission: EM | Admit: 2022-04-07 | Discharge: 2022-04-07 | Disposition: A | Discharge status: Home / Self Care | Payer: BC Managed Care – PPO | Attending: Internal Medicine | Admitting: Internal Medicine

## 2022-04-07 DIAGNOSIS — Z203 Contact with and (suspected) exposure to rabies: Secondary | ICD-10-CM

## 2022-04-07 MED ORDER — RABIES VACCINE, PCEC IM SUSR
INTRAMUSCULAR | Status: AC
  Filled 2022-04-07: qty 1

## 2022-04-07 MED ORDER — RABIES VACCINE, PCEC IM SUSR
1.0000 mL | Freq: Once | INTRAMUSCULAR | Status: AC
  Administered 2022-04-07: 1 mL via INTRAMUSCULAR

## 2022-04-07 NOTE — ED Triage Notes (Signed)
Pt here for 4th rabies injection 

## 2022-04-21 DIAGNOSIS — R21 Rash and other nonspecific skin eruption: Secondary | ICD-10-CM | POA: Diagnosis not present

## 2022-08-12 ENCOUNTER — Other Ambulatory Visit (HOSPITAL_COMMUNITY): Payer: Self-pay

## 2022-08-12 MED ORDER — OZEMPIC (1 MG/DOSE) 4 MG/3ML ~~LOC~~ SOPN
1.0000 mg | PEN_INJECTOR | SUBCUTANEOUS | 0 refills | Status: AC
  Filled 2022-08-12: qty 3, 28d supply, fill #0

## 2022-09-03 DIAGNOSIS — Z0000 Encounter for general adult medical examination without abnormal findings: Secondary | ICD-10-CM | POA: Diagnosis not present

## 2022-09-03 DIAGNOSIS — E1169 Type 2 diabetes mellitus with other specified complication: Secondary | ICD-10-CM | POA: Diagnosis not present

## 2022-09-03 DIAGNOSIS — I10 Essential (primary) hypertension: Secondary | ICD-10-CM | POA: Diagnosis not present

## 2022-09-03 DIAGNOSIS — Z23 Encounter for immunization: Secondary | ICD-10-CM | POA: Diagnosis not present

## 2022-09-03 DIAGNOSIS — E785 Hyperlipidemia, unspecified: Secondary | ICD-10-CM | POA: Diagnosis not present

## 2022-09-04 DIAGNOSIS — E1169 Type 2 diabetes mellitus with other specified complication: Secondary | ICD-10-CM | POA: Diagnosis not present

## 2022-09-04 DIAGNOSIS — E785 Hyperlipidemia, unspecified: Secondary | ICD-10-CM | POA: Diagnosis not present

## 2022-09-04 DIAGNOSIS — Z862 Personal history of diseases of the blood and blood-forming organs and certain disorders involving the immune mechanism: Secondary | ICD-10-CM | POA: Diagnosis not present

## 2022-10-16 DIAGNOSIS — H524 Presbyopia: Secondary | ICD-10-CM | POA: Diagnosis not present

## 2022-10-16 DIAGNOSIS — E119 Type 2 diabetes mellitus without complications: Secondary | ICD-10-CM | POA: Diagnosis not present

## 2022-10-16 DIAGNOSIS — H35033 Hypertensive retinopathy, bilateral: Secondary | ICD-10-CM | POA: Diagnosis not present

## 2022-12-04 DIAGNOSIS — E1169 Type 2 diabetes mellitus with other specified complication: Secondary | ICD-10-CM | POA: Diagnosis not present

## 2022-12-04 DIAGNOSIS — E785 Hyperlipidemia, unspecified: Secondary | ICD-10-CM | POA: Diagnosis not present

## 2022-12-04 DIAGNOSIS — D649 Anemia, unspecified: Secondary | ICD-10-CM | POA: Diagnosis not present

## 2023-01-25 ENCOUNTER — Encounter (HOSPITAL_COMMUNITY): Payer: Self-pay

## 2023-01-25 ENCOUNTER — Emergency Department (HOSPITAL_COMMUNITY)
Admission: EM | Admit: 2023-01-25 | Discharge: 2023-01-25 | Disposition: A | Discharge status: Home / Self Care | Payer: BC Managed Care – PPO | Attending: Emergency Medicine | Admitting: Emergency Medicine

## 2023-01-25 ENCOUNTER — Other Ambulatory Visit: Payer: Self-pay

## 2023-01-25 DIAGNOSIS — I10 Essential (primary) hypertension: Secondary | ICD-10-CM | POA: Insufficient documentation

## 2023-01-25 DIAGNOSIS — Z7984 Long term (current) use of oral hypoglycemic drugs: Secondary | ICD-10-CM | POA: Diagnosis not present

## 2023-01-25 DIAGNOSIS — R55 Syncope and collapse: Secondary | ICD-10-CM | POA: Insufficient documentation

## 2023-01-25 DIAGNOSIS — E119 Type 2 diabetes mellitus without complications: Secondary | ICD-10-CM | POA: Insufficient documentation

## 2023-01-25 DIAGNOSIS — Z79899 Other long term (current) drug therapy: Secondary | ICD-10-CM | POA: Insufficient documentation

## 2023-01-25 MED ORDER — SODIUM CHLORIDE 0.9 % IV BOLUS: 1000.0000 mL | Freq: Once | INTRAVENOUS | Status: DC

## 2023-01-25 NOTE — ED Provider Notes (Signed)
[place] [facility] DE[facility]MENT AT [facility] Provider Note   CSN: [id number] Arrival date & time: 01/25/23  0028     History  Chief Complaint  Patient presents with   Loss of Consciousness    Eileen Larsen is a 52 y.o. female.  The history is provided by the patient.  Loss of Consciousness She has history of hypertension, diabetes, hyperlipidemia and had a syncopal episode when she heard some bad news about a family member who is in the emergency department.  She denies chest pain, heaviness, tightness, pressure.  She denies dyspnea, nausea, vomiting, diaphoresis.  She has had history of syncope in the past.   Home Medications Prior to Admission medications   Medication Sig Start Date End Date Taking? Authorizing Provider  acetaminophen (TYLENOL) 500 MG tablet Take 1 tablet (500 mg total) by mouth every 6 (six) hours as needed. For use AFTER surgery 05/15/20   Joni Fears C, PA-C  amLODipine (NORVASC) 5 MG tablet Take 5 mg by mouth daily.    [provider]  atorvastatin (LIPITOR) 20 MG tablet Take 20 mg by mouth daily.    [provider]  hydrochlorothiazide (HYDRODIURIL) 25 MG tablet Take 25 mg by mouth daily.    [provider]  ibuprofen (ADVIL) 600 MG tablet Take 1 tablet (600 mg total) by mouth every 6 (six) hours as needed for mild pain or moderate pain. For use AFTER surgery 05/15/20   Joni Fears C, PA-C  ibuprofen (ADVIL,MOTRIN) 800 MG tablet Take 1 tablet (800 mg total) by mouth 3 (three) times daily. 07/10/17   Brayton El, PA-C  metFORMIN (GLUCOPHAGE) 500 MG tablet Take 1,000 mg by mouth 2 (two) times daily with a meal.     [provider]  Semaglutide, 1 MG/DOSE, (OZEMPIC, 1 MG/DOSE,) 4 MG/3ML SOPN Inject 1 mg into the skin once a week. 07/16/22         Allergies    Patient has no known allergies.    Review of Systems   Review of Systems  Cardiovascular:  Positive for syncope.  All other systems reviewed  and are negative.   Physical Exam Updated Vital Signs BP 112/78   Pulse 85   Temp 98.3 F (36.8 C)   Resp 19   SpO2 98%  Physical Exam Vitals and nursing note reviewed.   52 year old female, resting comfortably and in no acute distress. Vital signs are normal. Oxygen saturation is 98%, which is normal. Head is normocephalic and atraumatic. PERRLA, EOMI. Oropharynx is clear. Neck is nontender and supple without adenopathy or JVD. Back is nontender and there is no CVA tenderness. Lungs are clear without rales, wheezes, or rhonchi. Chest is nontender. Heart has regular rate and rhythm without murmur. Abdomen is soft, flat, nontender. Extremities have no cyanosis or edema, full range of motion is present. Skin is warm and dry without rash. Neurologic: Mental status is normal, cranial nerves are intact, moves all extremities equally.   Procedures Procedures    Medications Ordered in ED Medications - No data to display  ED Course/ Medical Decision Making/ A&P                             Medical Decision Making Amount and/or Complexity of Data Reviewed Labs: ordered.   Syncope without concerning features and history.  I have ordered laboratory workup of CBC, basic metabolic panel, electrocardiogram.  I have also ordered IV  fluids.  Patient is refusing all workup and treatment.  With no concerning features on history or physical, I feel it is safe to discharge the patient but she is advised to return if she has any new or concerning symptoms.  Final Clinical Impression(s) / ED Diagnoses Final diagnoses:  Syncope, unspecified syncope type    Rx / DC Orders ED Discharge Orders     None         Dione Booze, MD 01/25/23 [phone]

## 2023-01-25 NOTE — Discharge Instructions (Signed)
Make sure that you drink enough fluids.  Return if you have any new or concerning symptoms.

## 2023-01-25 NOTE — ED Triage Notes (Signed)
Pt. Here visiting another patient when she began to pass out. Pt. Assisted to the ground by ED staff and placed in hall D. Pt. Has hx of diabetes. CBG 110. Pt. Was clammy and pale when she passed out. She is now A&O x4.

## 2023-01-28 LAB — CBG MONITORING, ED: Glucose-Capillary: 110 mg/dL — ABNORMAL HIGH (ref 70–99)

## 2023-03-03 IMAGING — CT CT CARDIAC CORONARY ARTERY CALCIUM SCORE
3 series · 14 of 20 positions shown, 15 images · non-contrast
Comparison: None.

Addendum:
CLINICAL DATA: Cardiovascular Disease Risk stratification

EXAM:
Coronary Calcium Score
TECHNIQUE: A gated, non-contrast computed tomography scan of the heart was
performed using 3mm slice thickness. Axial images were analyzed on a
dedicated workstation. Calcium scoring of the coronary arteries was
performed using the Agatston method.

[Series 3: ax ca scr 70% (id) · axial · 0.39mm/px · z∈[+992,+1102]mm · 6 of 78 slices shown]
[im 12/78  vessel]
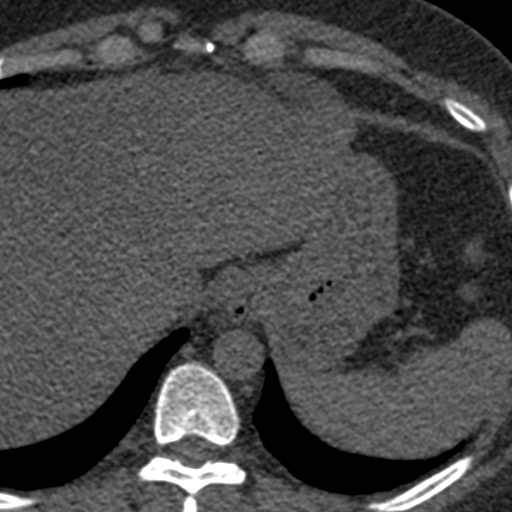
[im 23/78  vessel]
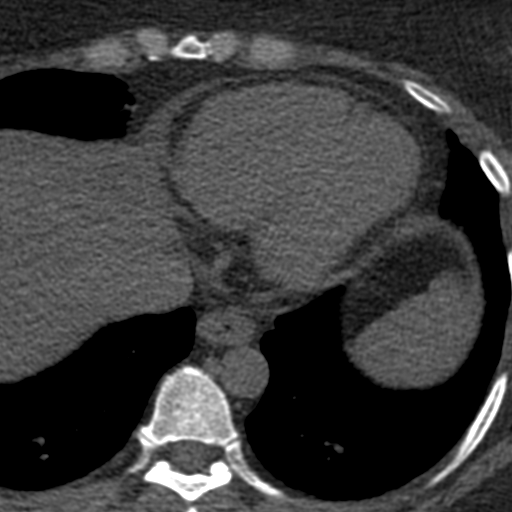
[im 34/78  vessel]
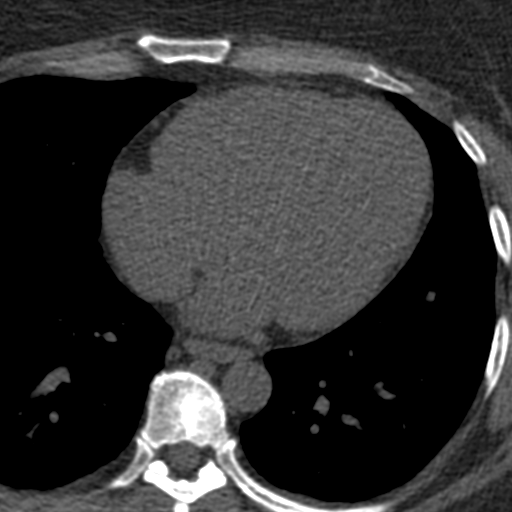
[im 45/78  vessel]
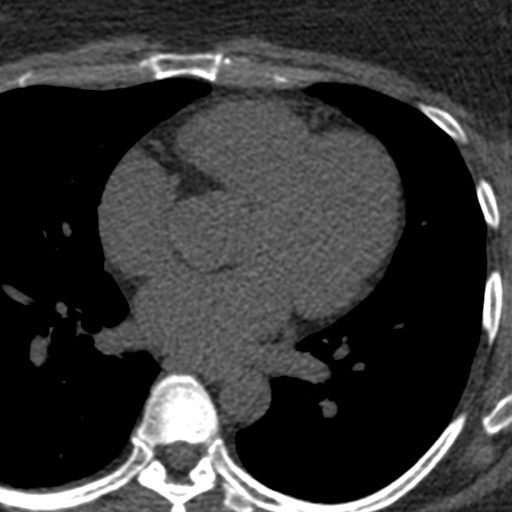
[im 56/78  vessel]
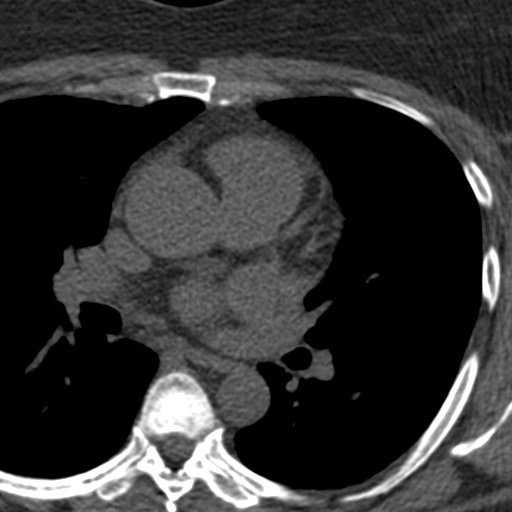
[im 67/78  vessel]
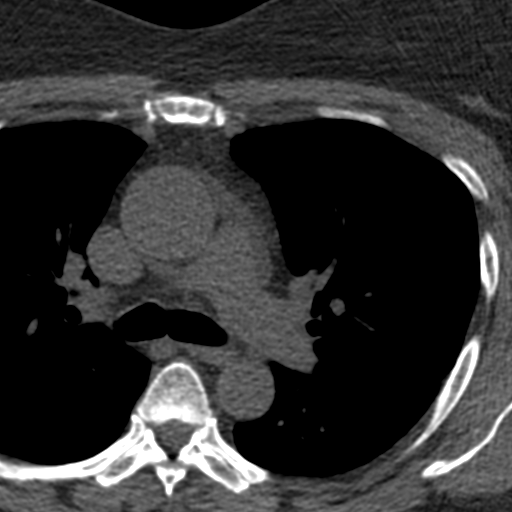

[Series 4: ax st · axial · 0.71mm/px · z∈[+1000,+1090]mm · 4 of 52 slices shown, 5 images]
[im 11/52  vessel]
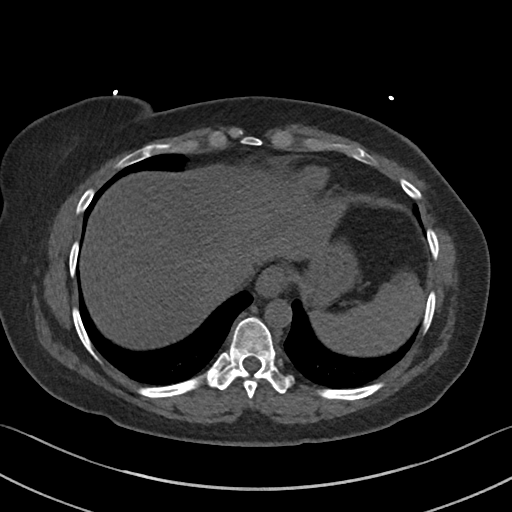
[im 11/52  lung]
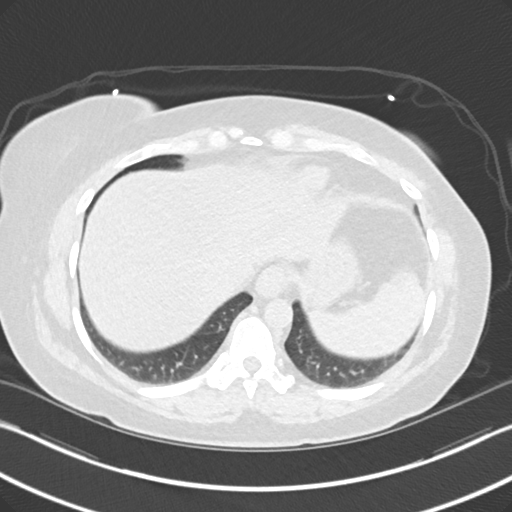
[im 21/52  vessel]
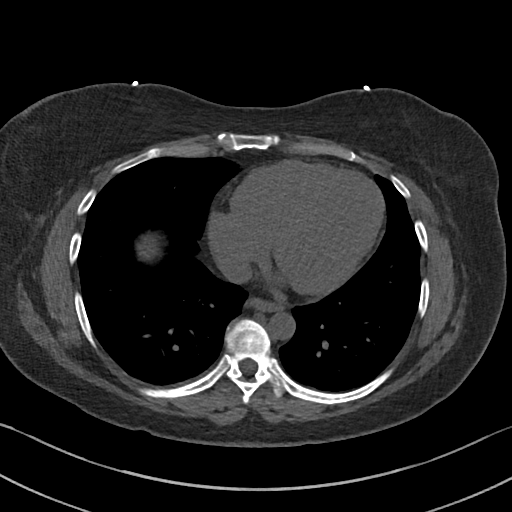
[im 31/52  vessel]
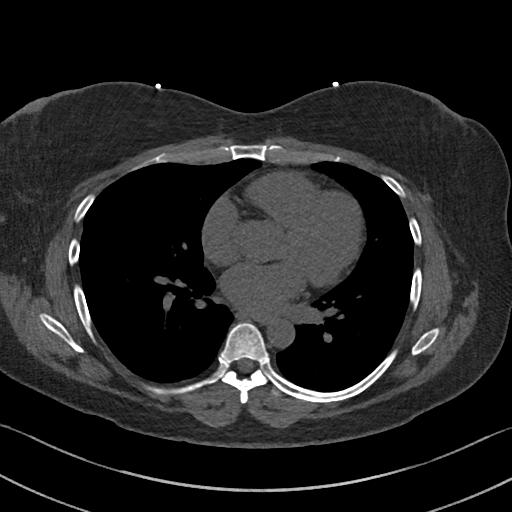
[im 41/52  vessel]
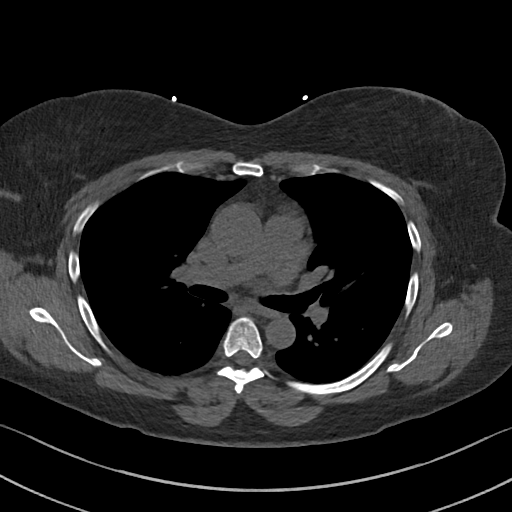

[Series 5: ax lung · axial · 0.71mm/px · z∈[+1000,+1090]mm · 4 of 52 slices shown]
[im 11/52  lung]
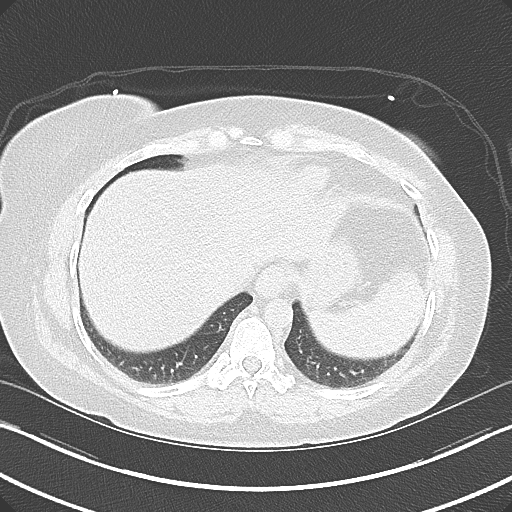
[im 21/52  lung]
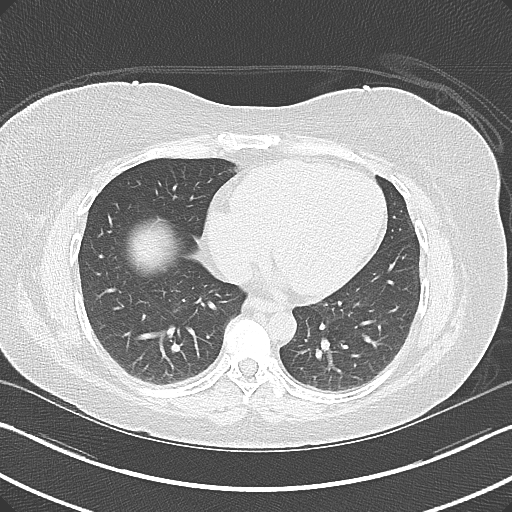
[im 31/52  lung]
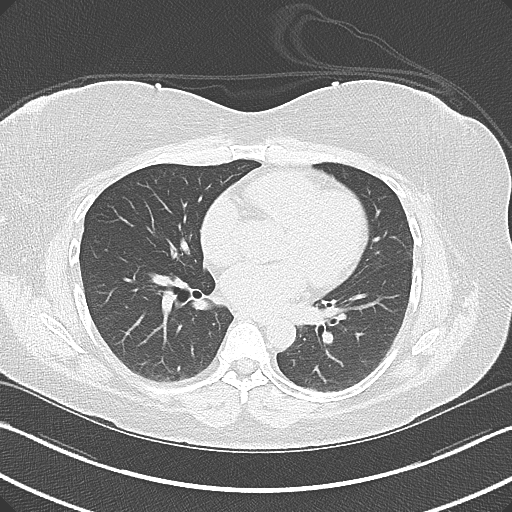
[im 41/52  lung]
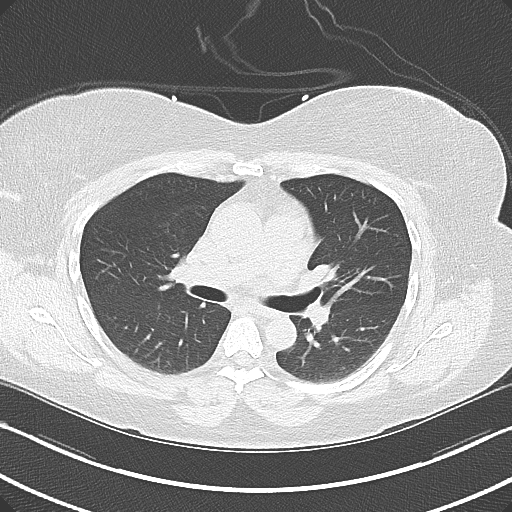

[14 of 20 positions shown; findings below may reference images not displayed]

FINDINGS: Coronary arteries: Normal origins.

Coronary Calcium Score:

Left main: 0

Left anterior descending artery: 0

Left circumflex artery: 0

Right coronary artery: 0

Total: 0

Percentile: 0

Pericardium: Normal.

Ascending Aorta: Normal caliber.

Non-cardiac: See separate report from [REDACTED].
IMPRESSION: Coronary calcium score of 0. This is a low risk study.



If CAC=0, it is reasonable to withhold statin therapy and reassess
in 5 to 10 years, as long as higher risk conditions are absent
(diabetes mellitus, family history of premature CHD in first degree
relatives (males <55 years; females <65 years), cigarette smoking,
or LDL >=190 mg/dL).

If CAC is 1 to 99, it is reasonable to initiate statin therapy for
patients >=55 years of age.

If CAC is >=100 or >=75th percentile, it is reasonable to initiate
statin therapy at any age.

Cardiology referral should be considered for patients with CAC
scores >=400 or >=75th percentile.

*5253 AHA/ACC/AACVPR/AAPA/ABC/TAXECHA/TIGER/BEJINARI/Jhoel Rodrigo/TOSHIYA/JEVAN/MORNYUIE
Guideline on the Management of Blood Cholesterol: A Report of the
American College of Cardiology/American Heart Association Task Force
on Clinical Practice Guidelines. J Am Coll Cardiol.
1560;73(24):2749-2207.

EXAM:
OVER-READ INTERPRETATION  CT CHEST

The following report is an over-read performed by radiologist Dr.
over-read does not include interpretation of cardiac or coronary
anatomy or pathology. The coronary calcium score interpretation by
the cardiologist is attached.
FINDINGS: No significant noncardiac vascular findings. Visualized mediastinum
and hilar regions demonstrate no lymphadenopathy or masses.
Visualized lungs show no evidence of pulmonary edema, consolidation,
pneumothorax, nodule or pleural fluid. Visualized upper abdomen
demonstrates evidence of hepatic steatosis. Visualized bony
structures are unremarkable.
IMPRESSION: Evidence of hepatic steatosis.

*** End of Addendum ***
FINDINGS: Coronary arteries: Normal origins.

Coronary Calcium Score:

Left main: 0

Left anterior descending artery: 0

Left circumflex artery: 0

Right coronary artery: 0

Total: 0

Percentile: 0

Pericardium: Normal.

Ascending Aorta: Normal caliber.

Non-cardiac: See separate report from [REDACTED].
IMPRESSION: Coronary calcium score of 0. This is a low risk study.



If CAC=0, it is reasonable to withhold statin therapy and reassess
in 5 to 10 years, as long as higher risk conditions are absent
(diabetes mellitus, family history of premature CHD in first degree
relatives (males <55 years; females <65 years), cigarette smoking,
or LDL >=190 mg/dL).

If CAC is 1 to 99, it is reasonable to initiate statin therapy for
patients >=55 years of age.

If CAC is >=100 or >=75th percentile, it is reasonable to initiate
statin therapy at any age.

Cardiology referral should be considered for patients with CAC
scores >=400 or >=75th percentile.

*5253 AHA/ACC/AACVPR/AAPA/ABC/TAXECHA/TIGER/BEJINARI/Jhoel Rodrigo/TOSHIYA/JEVAN/MORNYUIE
Guideline on the Management of Blood Cholesterol: A Report of the
American College of Cardiology/American Heart Association Task Force
on Clinical Practice Guidelines. J Am Coll Cardiol.
1560;73(24):2749-2207.

## 2023-03-17 DIAGNOSIS — H10011 Acute follicular conjunctivitis, right eye: Secondary | ICD-10-CM | POA: Diagnosis not present

## 2023-03-18 DIAGNOSIS — Z01419 Encounter for gynecological examination (general) (routine) without abnormal findings: Secondary | ICD-10-CM | POA: Diagnosis not present

## 2023-03-18 DIAGNOSIS — Z1231 Encounter for screening mammogram for malignant neoplasm of breast: Secondary | ICD-10-CM | POA: Diagnosis not present

## 2023-03-24 DIAGNOSIS — H10011 Acute follicular conjunctivitis, right eye: Secondary | ICD-10-CM | POA: Diagnosis not present

## 2023-03-27 DIAGNOSIS — N6325 Unspecified lump in the left breast, overlapping quadrants: Secondary | ICD-10-CM | POA: Diagnosis not present

## 2023-03-30 DIAGNOSIS — E785 Hyperlipidemia, unspecified: Secondary | ICD-10-CM | POA: Diagnosis not present

## 2023-03-30 DIAGNOSIS — E1169 Type 2 diabetes mellitus with other specified complication: Secondary | ICD-10-CM | POA: Diagnosis not present

## 2023-03-30 DIAGNOSIS — R519 Headache, unspecified: Secondary | ICD-10-CM | POA: Diagnosis not present

## 2023-03-30 DIAGNOSIS — I10 Essential (primary) hypertension: Secondary | ICD-10-CM | POA: Diagnosis not present

## 2023-04-01 DIAGNOSIS — L304 Erythema intertrigo: Secondary | ICD-10-CM | POA: Diagnosis not present

## 2023-07-03 DIAGNOSIS — H00025 Hordeolum internum left lower eyelid: Secondary | ICD-10-CM | POA: Diagnosis not present

## 2023-09-07 ENCOUNTER — Other Ambulatory Visit (HOSPITAL_COMMUNITY): Payer: Self-pay

## 2023-09-17 DIAGNOSIS — Z862 Personal history of diseases of the blood and blood-forming organs and certain disorders involving the immune mechanism: Secondary | ICD-10-CM | POA: Diagnosis not present

## 2023-09-17 DIAGNOSIS — E785 Hyperlipidemia, unspecified: Secondary | ICD-10-CM | POA: Diagnosis not present

## 2023-09-17 DIAGNOSIS — E1169 Type 2 diabetes mellitus with other specified complication: Secondary | ICD-10-CM | POA: Diagnosis not present

## 2023-09-17 DIAGNOSIS — Z0000 Encounter for general adult medical examination without abnormal findings: Secondary | ICD-10-CM | POA: Diagnosis not present

## 2023-09-17 DIAGNOSIS — I10 Essential (primary) hypertension: Secondary | ICD-10-CM | POA: Diagnosis not present

## 2023-09-17 DIAGNOSIS — Z23 Encounter for immunization: Secondary | ICD-10-CM | POA: Diagnosis not present

## 2023-10-23 DIAGNOSIS — H2513 Age-related nuclear cataract, bilateral: Secondary | ICD-10-CM | POA: Diagnosis not present

## 2023-10-23 DIAGNOSIS — E119 Type 2 diabetes mellitus without complications: Secondary | ICD-10-CM | POA: Diagnosis not present

## 2023-10-23 DIAGNOSIS — E783 Hyperchylomicronemia: Secondary | ICD-10-CM | POA: Diagnosis not present

## 2023-10-23 DIAGNOSIS — H524 Presbyopia: Secondary | ICD-10-CM | POA: Diagnosis not present

## 2023-10-23 DIAGNOSIS — H35033 Hypertensive retinopathy, bilateral: Secondary | ICD-10-CM | POA: Diagnosis not present

## 2023-12-02 DIAGNOSIS — E1169 Type 2 diabetes mellitus with other specified complication: Secondary | ICD-10-CM | POA: Diagnosis not present

## 2023-12-02 DIAGNOSIS — M791 Myalgia, unspecified site: Secondary | ICD-10-CM | POA: Diagnosis not present

## 2023-12-02 DIAGNOSIS — R5383 Other fatigue: Secondary | ICD-10-CM | POA: Diagnosis not present

## 2023-12-02 DIAGNOSIS — M7918 Myalgia, other site: Secondary | ICD-10-CM | POA: Diagnosis not present

## 2024-01-05 DIAGNOSIS — M7552 Bursitis of left shoulder: Secondary | ICD-10-CM | POA: Diagnosis not present

## 2024-01-13 DIAGNOSIS — M7552 Bursitis of left shoulder: Secondary | ICD-10-CM | POA: Diagnosis not present

## 2024-01-15 DIAGNOSIS — M7552 Bursitis of left shoulder: Secondary | ICD-10-CM | POA: Diagnosis not present

## 2024-01-18 DIAGNOSIS — M7552 Bursitis of left shoulder: Secondary | ICD-10-CM | POA: Diagnosis not present

## 2024-01-20 DIAGNOSIS — M7552 Bursitis of left shoulder: Secondary | ICD-10-CM | POA: Diagnosis not present

## 2024-01-29 DIAGNOSIS — M7552 Bursitis of left shoulder: Secondary | ICD-10-CM | POA: Diagnosis not present

## 2024-02-18 DIAGNOSIS — H00022 Hordeolum internum right lower eyelid: Secondary | ICD-10-CM | POA: Diagnosis not present

## 2024-03-04 DIAGNOSIS — M7552 Bursitis of left shoulder: Secondary | ICD-10-CM | POA: Diagnosis not present

## 2024-03-11 DIAGNOSIS — M7552 Bursitis of left shoulder: Secondary | ICD-10-CM | POA: Diagnosis not present

## 2024-03-23 DIAGNOSIS — E1169 Type 2 diabetes mellitus with other specified complication: Secondary | ICD-10-CM | POA: Diagnosis not present

## 2024-03-23 DIAGNOSIS — I10 Essential (primary) hypertension: Secondary | ICD-10-CM | POA: Diagnosis not present

## 2024-03-23 DIAGNOSIS — E785 Hyperlipidemia, unspecified: Secondary | ICD-10-CM | POA: Diagnosis not present

## 2024-03-23 DIAGNOSIS — Z1231 Encounter for screening mammogram for malignant neoplasm of breast: Secondary | ICD-10-CM | POA: Diagnosis not present

## 2024-03-23 DIAGNOSIS — Z23 Encounter for immunization: Secondary | ICD-10-CM | POA: Diagnosis not present

## 2024-04-04 DIAGNOSIS — Z01419 Encounter for gynecological examination (general) (routine) without abnormal findings: Secondary | ICD-10-CM | POA: Diagnosis not present

## 2024-04-11 ENCOUNTER — Other Ambulatory Visit: Payer: Self-pay | Admitting: Family Medicine

## 2024-04-11 DIAGNOSIS — Z87898 Personal history of other specified conditions: Secondary | ICD-10-CM

## 2024-04-11 DIAGNOSIS — R519 Headache, unspecified: Secondary | ICD-10-CM

## 2024-05-31 DIAGNOSIS — Z789 Other specified health status: Secondary | ICD-10-CM | POA: Diagnosis not present

## 2024-05-31 DIAGNOSIS — L728 Other follicular cysts of the skin and subcutaneous tissue: Secondary | ICD-10-CM | POA: Diagnosis not present

## 2024-08-08 DIAGNOSIS — L720 Epidermal cyst: Secondary | ICD-10-CM | POA: Diagnosis not present

## 2024-08-08 DIAGNOSIS — R208 Other disturbances of skin sensation: Secondary | ICD-10-CM | POA: Diagnosis not present

## 2024-08-08 DIAGNOSIS — L723 Sebaceous cyst: Secondary | ICD-10-CM | POA: Diagnosis not present

## 2024-08-08 DIAGNOSIS — L728 Other follicular cysts of the skin and subcutaneous tissue: Secondary | ICD-10-CM | POA: Diagnosis not present
